# Patient Record
Sex: Male | Born: 1935 | Race: Black or African American | Hispanic: No | Marital: Married | Smoking: Current every day smoker
Health system: Southern US, Community
[De-identification: ages and names within clinical notes are randomized; demographics above are authoritative.]

## PROBLEM LIST (undated history)

## (undated) DIAGNOSIS — M1990 Unspecified osteoarthritis, unspecified site: Secondary | ICD-10-CM

## (undated) DIAGNOSIS — T783XXA Angioneurotic edema, initial encounter: Secondary | ICD-10-CM

## (undated) DIAGNOSIS — M545 Low back pain, unspecified: Secondary | ICD-10-CM

## (undated) DIAGNOSIS — K219 Gastro-esophageal reflux disease without esophagitis: Secondary | ICD-10-CM

## (undated) DIAGNOSIS — E7800 Pure hypercholesterolemia, unspecified: Secondary | ICD-10-CM

## (undated) DIAGNOSIS — R269 Unspecified abnormalities of gait and mobility: Secondary | ICD-10-CM

## (undated) DIAGNOSIS — H409 Unspecified glaucoma: Secondary | ICD-10-CM

## (undated) DIAGNOSIS — E119 Type 2 diabetes mellitus without complications: Secondary | ICD-10-CM

## (undated) DIAGNOSIS — E663 Overweight: Secondary | ICD-10-CM

## (undated) DIAGNOSIS — I10 Essential (primary) hypertension: Secondary | ICD-10-CM

## (undated) DIAGNOSIS — F17200 Nicotine dependence, unspecified, uncomplicated: Secondary | ICD-10-CM

## (undated) DIAGNOSIS — I4510 Unspecified right bundle-branch block: Secondary | ICD-10-CM

## (undated) HISTORY — DX: Low back pain: M54.5

## (undated) HISTORY — DX: Angioneurotic edema, initial encounter: T78.3XXA

## (undated) HISTORY — DX: Overweight: E66.3

## (undated) HISTORY — DX: Gastro-esophageal reflux disease without esophagitis: K21.9

## (undated) HISTORY — PX: OTHER SURGICAL HISTORY: SHX169

## (undated) HISTORY — DX: Type 2 diabetes mellitus without complications: E11.9

## (undated) HISTORY — DX: Unspecified abnormalities of gait and mobility: R26.9

## (undated) HISTORY — DX: Unspecified glaucoma: H40.9

## (undated) HISTORY — DX: Nicotine dependence, unspecified, uncomplicated: F17.200

## (undated) HISTORY — DX: Unspecified right bundle-branch block: I45.10

## (undated) HISTORY — DX: Essential (primary) hypertension: I10

## (undated) HISTORY — DX: Unspecified osteoarthritis, unspecified site: M19.90

## (undated) HISTORY — DX: Low back pain, unspecified: M54.50

## (undated) HISTORY — DX: Pure hypercholesterolemia, unspecified: E78.00

---

## 2002-12-30 ENCOUNTER — Encounter: Payer: Self-pay | Admitting: Ophthalmology

## 2003-01-02 ENCOUNTER — Ambulatory Visit (HOSPITAL_COMMUNITY): Admission: RE | Admit: 2003-01-02 | Discharge: 2003-01-02 | Payer: Self-pay | Admitting: Ophthalmology

## 2003-02-13 ENCOUNTER — Ambulatory Visit (HOSPITAL_COMMUNITY): Admission: RE | Admit: 2003-02-13 | Discharge: 2003-02-13 | Payer: Self-pay | Admitting: Ophthalmology

## 2004-04-12 ENCOUNTER — Ambulatory Visit (HOSPITAL_COMMUNITY): Admission: RE | Admit: 2004-04-12 | Discharge: 2004-04-12 | Payer: Self-pay | Admitting: Ophthalmology

## 2004-05-25 ENCOUNTER — Ambulatory Visit: Payer: Self-pay | Admitting: Pulmonary Disease

## 2004-08-25 ENCOUNTER — Ambulatory Visit: Payer: Self-pay | Admitting: Pulmonary Disease

## 2005-02-21 ENCOUNTER — Ambulatory Visit: Payer: Self-pay | Admitting: Pulmonary Disease

## 2005-05-30 ENCOUNTER — Ambulatory Visit: Payer: Self-pay | Admitting: Pulmonary Disease

## 2005-05-31 ENCOUNTER — Ambulatory Visit: Payer: Self-pay | Admitting: Pulmonary Disease

## 2005-08-22 ENCOUNTER — Ambulatory Visit: Payer: Self-pay | Admitting: Pulmonary Disease

## 2006-02-19 ENCOUNTER — Ambulatory Visit: Payer: Self-pay | Admitting: Pulmonary Disease

## 2006-02-20 ENCOUNTER — Ambulatory Visit: Payer: Self-pay | Admitting: Pulmonary Disease

## 2006-08-22 ENCOUNTER — Ambulatory Visit: Payer: Self-pay | Admitting: Pulmonary Disease

## 2007-02-18 ENCOUNTER — Ambulatory Visit: Payer: Self-pay | Admitting: Pulmonary Disease

## 2007-02-19 ENCOUNTER — Ambulatory Visit: Payer: Self-pay | Admitting: Pulmonary Disease

## 2007-02-19 LAB — CONVERTED CEMR LAB
ALT: 16 units/L (ref 0–53)
AST: 18 units/L (ref 0–37)
Alkaline Phosphatase: 55 units/L (ref 39–117)
BUN: 15 mg/dL (ref 6–23)
Basophils Absolute: 0 10*3/uL (ref 0.0–0.1)
Basophils Relative: 0.1 % (ref 0.0–1.0)
Bilirubin, Direct: 0.1 mg/dL (ref 0.0–0.3)
Calcium: 9 mg/dL (ref 8.4–10.5)
Eosinophils Absolute: 0.2 10*3/uL (ref 0.0–0.6)
GFR calc Af Amer: 93 mL/min
HDL: 41.8 mg/dL (ref >39.0)
LDL Cholesterol: 71 mg/dL (ref 0–99)
Neutrophils Relative %: 34.9 % — ABNORMAL LOW (ref 43.0–77.0)
PSA: 0.38 ng/mL (ref 0.10–4.00)
Platelets: 201 10*3/uL (ref 150–400)
Potassium: 4.4 meq/L (ref 3.5–5.1)
RBC: 3.71 M/uL — ABNORMAL LOW (ref 4.22–5.81)
RDW: 12.6 % (ref 11.5–14.6)
Sodium: 140 meq/L (ref 135–145)
TSH: 2.4 microintl units/mL (ref 0.35–5.50)
Total CHOL/HDL Ratio: 3
VLDL: 11 mg/dL (ref 0–40)

## 2007-08-20 DIAGNOSIS — M1990 Unspecified osteoarthritis, unspecified site: Secondary | ICD-10-CM

## 2007-08-20 DIAGNOSIS — E785 Hyperlipidemia, unspecified: Secondary | ICD-10-CM | POA: Insufficient documentation

## 2007-08-20 DIAGNOSIS — M545 Low back pain, unspecified: Secondary | ICD-10-CM | POA: Insufficient documentation

## 2007-08-20 DIAGNOSIS — I10 Essential (primary) hypertension: Secondary | ICD-10-CM

## 2007-08-20 DIAGNOSIS — I4510 Unspecified right bundle-branch block: Secondary | ICD-10-CM

## 2007-08-21 ENCOUNTER — Ambulatory Visit: Payer: Self-pay | Admitting: Pulmonary Disease

## 2007-09-07 DIAGNOSIS — F17200 Nicotine dependence, unspecified, uncomplicated: Secondary | ICD-10-CM | POA: Insufficient documentation

## 2007-09-07 DIAGNOSIS — H409 Unspecified glaucoma: Secondary | ICD-10-CM | POA: Insufficient documentation

## 2007-09-07 DIAGNOSIS — E663 Overweight: Secondary | ICD-10-CM | POA: Insufficient documentation

## 2008-02-17 ENCOUNTER — Ambulatory Visit: Payer: Self-pay | Admitting: Pulmonary Disease

## 2008-02-18 ENCOUNTER — Ambulatory Visit: Payer: Self-pay | Admitting: Pulmonary Disease

## 2008-02-21 LAB — CONVERTED CEMR LAB
ALT: 13 units/L (ref 0–53)
AST: 16 units/L (ref 0–37)
Alkaline Phosphatase: 59 units/L (ref 39–117)
Basophils Relative: 0.3 % (ref 0.0–3.0)
Bilirubin, Direct: 0.1 mg/dL (ref 0.0–0.3)
CO2: 26 meq/L (ref 19–32)
Calcium: 9.1 mg/dL (ref 8.4–10.5)
Chloride: 108 meq/L (ref 96–112)
Cholesterol: 112 mg/dL (ref 0–200)
Creatinine, Ser: 1.2 mg/dL (ref 0.4–1.5)
Eosinophils Absolute: 0.4 10*3/uL (ref 0.0–0.7)
GFR calc Af Amer: 75 mL/min
Glucose, Bld: 113 mg/dL — ABNORMAL HIGH (ref 70–99)
HCT: 34.5 % — ABNORMAL LOW (ref 39.0–52.0)
HDL: 30.1 mg/dL — ABNORMAL LOW (ref >39.0)
MCV: 99.5 fL (ref 78.0–100.0)
Neutrophils Relative %: 36.3 % — ABNORMAL LOW (ref 43.0–77.0)
Platelets: 200 10*3/uL (ref 150–400)
RDW: 11.8 % (ref 11.5–14.6)
Sodium: 140 meq/L (ref 135–145)
VLDL: 14 mg/dL (ref 0–40)
WBC: 6.1 10*3/uL (ref 4.5–10.5)

## 2008-08-17 ENCOUNTER — Ambulatory Visit: Payer: Self-pay | Admitting: Pulmonary Disease

## 2009-02-15 ENCOUNTER — Ambulatory Visit: Payer: Self-pay | Admitting: Pulmonary Disease

## 2009-02-15 LAB — CONVERTED CEMR LAB
AST: 16 units/L (ref 0–37)
Albumin: 3.9 g/dL (ref 3.5–5.2)
Alkaline Phosphatase: 52 units/L (ref 39–117)
Basophils Relative: 2.7 % (ref 0.0–3.0)
CO2: 30 meq/L (ref 19–32)
Chloride: 107 meq/L (ref 96–112)
Cholesterol: 137 mg/dL (ref 0–200)
Eosinophils Absolute: 0.3 10*3/uL (ref 0.0–0.7)
GFR calc non Af Amer: 82.58 mL/min (ref >60)
Glucose, Bld: 102 mg/dL — ABNORMAL HIGH (ref 70–99)
HCT: 36.2 % — ABNORMAL LOW (ref 39.0–52.0)
HDL: 44.2 mg/dL (ref >39.00)
LDL Cholesterol: 76 mg/dL (ref 0–99)
Lymphocytes Relative: 47.7 % — ABNORMAL HIGH (ref 12.0–46.0)
Neutro Abs: 2.3 10*3/uL (ref 1.4–7.7)
Neutrophils Relative %: 35.9 % — ABNORMAL LOW (ref 43.0–77.0)
PSA: 0.39 ng/mL (ref 0.10–4.00)
Platelets: 181 10*3/uL (ref 150.0–400.0)
Potassium: 4.2 meq/L (ref 3.5–5.1)
RBC: 3.62 M/uL — ABNORMAL LOW (ref 4.22–5.81)
RDW: 12.3 % (ref 11.5–14.6)
Sodium: 141 meq/L (ref 135–145)
TSH: 1.53 microintl units/mL (ref 0.35–5.50)
Total Protein: 7.8 g/dL (ref 6.0–8.3)
WBC: 6.3 10*3/uL (ref 4.5–10.5)

## 2009-08-16 ENCOUNTER — Ambulatory Visit: Payer: Self-pay | Admitting: Pulmonary Disease

## 2010-02-14 ENCOUNTER — Ambulatory Visit: Payer: Self-pay | Admitting: Pulmonary Disease

## 2010-02-15 ENCOUNTER — Ambulatory Visit: Payer: Self-pay | Admitting: Pulmonary Disease

## 2010-02-21 LAB — CONVERTED CEMR LAB
ALT: 12 units/L (ref 0–53)
Alkaline Phosphatase: 54 units/L (ref 39–117)
BUN: 21 mg/dL (ref 6–23)
Bilirubin, Direct: 0.1 mg/dL (ref 0.0–0.3)
Chloride: 104 meq/L (ref 96–112)
GFR calc non Af Amer: 85.05 mL/min (ref >60)
Glucose, Bld: 110 mg/dL — ABNORMAL HIGH (ref 70–99)
HDL: 43.4 mg/dL (ref >39.00)
Hemoglobin: 12.2 g/dL — ABNORMAL LOW (ref 13.0–17.0)
LDL Cholesterol: 77 mg/dL (ref 0–99)
Lymphocytes Relative: 45.4 % (ref 12.0–46.0)
Lymphs Abs: 2.7 10*3/uL (ref 0.7–4.0)
MCV: 100.2 fL — ABNORMAL HIGH (ref 78.0–100.0)
Monocytes Absolute: 0.6 10*3/uL (ref 0.1–1.0)
Monocytes Relative: 10.4 % (ref 3.0–12.0)
Neutro Abs: 2.4 10*3/uL (ref 1.4–7.7)
Neutrophils Relative %: 40 % — ABNORMAL LOW (ref 43.0–77.0)
Platelets: 177 10*3/uL (ref 150.0–400.0)
RDW: 13.5 % (ref 11.5–14.6)
Sodium: 141 meq/L (ref 135–145)
VLDL: 12.2 mg/dL (ref 0.0–40.0)

## 2010-08-09 NOTE — Assessment & Plan Note (Signed)
Summary: 79m follow up/klw   Visit Type:  followup  CC:  6 month ROV & review of mult medical problems....  History of Present Illness: 77 y/o BM here for a follow up visit...  he has multiple medical problems as noted below...     ~  August 17, 2008:  today c/o arthritis pain, esp in his hands... he uses Tramadol Prn w/ good response... no other complaints or concerns... he wants refill of meds for 2010... he is due for Tetanus shot & Pneumovax, he decided against the flu shots in 2009...   ~  February 15, 2009:  good 6 months- still smoking his pipe- 1 pouch qweek "it's the only fun I have"... no new complaints or concerns... due for yearly CXR & fasting blood work...   ~  August 16, 2009:  he continues stable- no new complaints... BP controlled on meds;  denies CP, palpit, SOB, or edema;  still smoking his pipe... he requests refills for 2011.   ~  February 14, 2010:  another stable 47mo- no new complaints or concerns... his wife was Dx w/ colon cancer- s/p surg & apparently one pos node> incr stress for the family...  BP remains controlled;  Chol has been good on the Simva40;  weight stable;  atthritis reasonable w/ Tramadol Prn;  interestingly> he still refuses colonoscopy despite wife's dx w/ colon ca... he will ret for FASTING labs.    Current Problem List:  GLAUCOMA (ICD-365.9) - on gtts per [facility]...  PIPE SMOKER (ICD-305.1) - still not interested in smoking cessation counselling...  HYPERTENSION (ICD-401.9) - controlled on TOPROL XL 50mg /d and HYZAAR 50/d... BP=130/68 doing well- denies HA, fatigue, visual changes, CP, palipit, dizziness, syncope, dyspnea, edema, etc...  ~  CXR 8/10 showed norm heart size, sl incr markings, NAD...  Labs WNL...  RIGHT BUNDLE BRANCH BLOCK (ICD-426.4) - on ASA 81mg /d... old EKG's w/ RBBB, otherw normal... as noted- no CP, palpit, SOB, etc...  HYPERCHOLESTEROLEMIA (ICD-272.0) - on SIMVASTATIN 40mg /d + diet...   ~  FLP 8/08 showed TChol  124, TG 56, HDL 42, LDL 71... continue Rx.  ~  FLP 8/09 showed TChol 112, TG 72, HDL 30, LDL 68... rec- same med, incr exercise.  ~  FLP 8/10 showed TChol 137, TG 85, HDL 44, LDL 76  ~  FLP 8/11 showed TChol 133, TG 61, HDL 43, LDL 77  OVERWEIGHT (ICD-278.02) - discussed diet + exercise program...  ~  weight 8/09 = 202#  ~  weight 2/10 = 211#... he knows that he needs to do better.  ~  weight 8/10 = 210#  ~  weight 2/11 = 209#  ~  weight 8/11 = 211#  GERD (ICD-530.81) - no recent symptoms... doing satis overall... we discussed Prilosec vs Zantac OTC Prn.  DEGENERATIVE JOINT DISEASE (ICD-715.90) - using Tramadol Prn... he notes some wrist pain- ACE wrap + Tramadol helps.  BACK PAIN, LUMBAR (ICD-724.2)  ABNORMALITY OF GAIT (ICD-781.2)  Hx of ANGIOEDEMA (ICD-995.1) - ? etiology... no recurrence.  SURG/OTH PROC NOT CARRIED OUT BECAUSE PTS DECN (ICD-V64.2) - discussed again... he continues to refuse routine colonoscopy despite his wife's Dx w/ colon cancer 5/11...  Health Maintenance - he decided against the flu shots in 2009 & 2010... had TETANUS shot & PNEUMOVAX Feb2010...   Preventive Screening-Counseling & Management  Alcohol-Tobacco     Smoking Status: current pipe smoker     Pipe use/week: pipe 3 x a week  Current Medications (verified): 1)  Cosopt 2-0.5 %  Soln (Dorzolamide-Timolol) .... As Needed 2)  Travatan 0.004 %  Soln (Travoprost) .... As Needed 3)  Adult Aspirin Low Strength 81 Mg  Tbdp (Aspirin) .Marland Kitchen.. 1 Tab Daily.Marland KitchenMarland Kitchen 4)  Toprol Xl 50 Mg  Tb24 (Metoprolol Succinate) .... Take 1 Tablet By Mouth Once A Day 5)  Hyzaar 50-12.5 Mg  Tabs (Losartan Potassium-Hctz) .... Take 1 Tablet By Mouth Once A Day 6)  Simvastatin 40 Mg  Tabs (Simvastatin) .... Take One Tablet By Mouth At Bedtime 7)  Tramadol Hcl 50 Mg  Tabs (Tramadol Hcl) .... Take One Tablet By Mouth Every 4-6 Hours As Needed For Arthritis Pain...  Allergies: 1)  ! Iodine  Past History:  Past Medical  History: GLAUCOMA (ICD-365.9) PIPE SMOKER (ICD-305.1) HYPERTENSION (ICD-401.9) RIGHT BUNDLE BRANCH BLOCK (ICD-426.4) HYPERCHOLESTEROLEMIA (ICD-272.0) OVERWEIGHT (ICD-278.02) GERD (ICD-530.81) DEGENERATIVE JOINT DISEASE (ICD-715.90) BACK PAIN, LUMBAR (ICD-724.2) ABNORMALITY OF GAIT (ICD-781.2) Hx of ANGIOEDEMA (ICD-995.1) SURG/OTH PROC NOT CARRIED OUT BECAUSE PTS DECN (ICD-V64.2) - he has repeatedly refused colonoscopy... also refuses FLU shots...  Past Surgical History: S/P cataract surgery in 2004 & 2005 by DrJacklin  Family History: Reviewed history from 02/15/2009 and no changes required. Father died in his 77's w/ GSW, hx of heart disease Mother died in her 9's ?cause 5 Siblings: 2 Brothers died- trauma, alcoholism 3 Sisters- one died w/ internal bleeding  Social History: Reviewed history from 02/15/2009 and no changes required. married, wife= Ora, 51 yrs. 2 children etoh occasionally no caffeine current smoker, pipe daily... retired [employer] Smoking Status:  current pipe smoker  Review of Systems      See HPI       The patient complains of dyspnea on exertion and difficulty walking.  The patient denies anorexia, fever, weight loss, weight gain, vision loss, decreased hearing, hoarseness, chest pain, syncope, peripheral edema, prolonged cough, headaches, hemoptysis, abdominal pain, melena, hematochezia, severe indigestion/heartburn, hematuria, incontinence, muscle weakness, suspicious skin lesions, transient blindness, depression, unusual weight change, abnormal bleeding, enlarged lymph nodes, and angioedema.    Vital Signs:  Patient profile:   77 year old male Height:      66 inches Weight:      210.50 pounds BMI:     34.10 O2 Sat:      99 % on Room air Temp:     98.1 degrees F oral Pulse rate:   59 / minute BP sitting:   130 / 68  (right arm) Cuff size:   regular  Vitals Entered By: Kandice Hams CMA (February 14, 2010 11:35 AM)  O2 Flow:  Room air CC: 6  month ROV & review of mult medical problems...   Physical Exam  Additional Exam:  WD, WN, 77 y/o BM in NAD... GENERAL:  Alert & oriented; pleasant & cooperative... HEENT:  /AT, EOM-wnl, PERRLA, EACs-clear, TMs-wnl, NOSE-clear, THROAT-clear & wnl. NECK:  Supple w/ fairROM; no JVD; normal carotid impulses w/o bruits; no thyromegaly or nodules palpated; no lymphadenopathy. CHEST:  Clear to P & A; without wheezes/ rales/ or rhonchi heard...  HEART:  Regular Rhythm; without murmurs/ rubs/ or gallops detected... ABDOMEN:  Obese, soft & nontender; normal bowel sounds; no organomegaly or masses palpated... EXT: without deformities, mild arthritic changes; no varicose veins/ venous insuffic/ or edema. NEURO:  CN's intact; +gait abn;  no focal defects... DERM:  No lesions noted; no rash etc...    MISC. Report  Procedure date:  02/15/2010  Findings:      BMP (  METABOL)   Sodium                    141 mEq/L                   135-145   Potassium                 4.3 mEq/L                   3.5-5.1   Chloride                  104 mEq/L                   96-112   Carbon Dioxide            27 mEq/L                    19-32   Glucose              [H]  110 mg/dL                   61-60   BUN                       21 mg/dL                    7-37   Creatinine                1.1 mg/dL                   1.0-6.2   Calcium                   9.1 mg/dL                   6.9-48.5   GFR                       85.05 mL/min                >60  Lipid Panel (LIPID)   Cholesterol               133 mg/dL                   4-627   Triglycerides             61.0 mg/dL                  0.3-500.9   HDL                       38.18 mg/dL                 >29.93   LDL Cholesterol           77 mg/dL                    7-16  Hepatic/Liver Function Panel (HEPATIC)   Total Bilirubin           0.6 mg/dL                   9.6-7.8   Direct Bilirubin          0.1 mg/dL  0.0-0.3   Alkaline Phosphatase       54 U/L                      39-117   AST                       15 U/L                      0-37   ALT                       12 U/L                      0-53   Total Protein             7.1 g/dL                    2.9-5.2   Albumin                   3.9 g/dL                    8.4-1.3  Comments:      CBC Platelet w/Diff (CBCD)   White Cell Count          6.0 K/uL                    4.5-10.5   Red Cell Count       [L]  3.52 Mil/uL                 4.22-5.81   Hemoglobin           [L]  12.2 g/dL                   24.4-01.0   Hematocrit           [L]  35.3 %                      39.0-52.0   MCV                  [H]  100.2 fl                    78.0-100.0   Platelet Count            177.0 K/uL                  150.0-400.0   Neutrophil %         [L]  40.0 %                      43.0-77.0   Lymphocyte %              45.4 %                      12.0-46.0   Monocyte %                10.4 %                      3.0-12.0   Eosinophils%              3.9 %  0.0-5.0   Basophils %               0.3 %                       0.0-3.0   TSH (TSH)   FastTSH                   2.17 uIU/mL                 0.35-5.50  Prostate Specific Antigen (PSA)   PSA-Hyb                   0.44 ng/mL                  0.10-4.00   Impression & Recommendations:  Problem # 1:  PIPE SMOKER (ICD-305.1) we discussed the need to quit all smoking again today...  Problem # 2:  HYPERTENSION (ICD-401.9) Controlled>  same meds. His updated medication list for this problem includes:    Toprol Xl 50 Mg Tb24 (Metoprolol succinate) .Marland Kitchen... Take 1 tablet by mouth once a day    Hyzaar 50-12.5 Mg Tabs (Losartan potassium-hctz) .Marland Kitchen... Take 1 tablet by mouth once a day  Problem # 3:  RIGHT BUNDLE BRANCH BLOCK (ICD-426.4) He is asymptomatic>  no CP, palpit, dizzy, light headed, syncope, etc...  Problem # 4:  HYPERCHOLESTEROLEMIA (ICD-272.0) FLP has been stable on the Simva40 + his diet... His updated medication list  for this problem includes:    Simvastatin 40 Mg Tabs (Simvastatin) .Marland Kitchen... Take one tablet by mouth at bedtime  Problem # 5:  OVERWEIGHT (ICD-278.02) We discussed weight reduction strategies today...  Problem # 6:  DEGENERATIVE JOINT DISEASE (ICD-715.90) He is reasonably well controlled w/ Prn Tramadol Rx... His updated medication list for this problem includes:    Adult Aspirin Low Strength 81 Mg Tbdp (Aspirin) .Marland Kitchen... 1 tab daily...    Tramadol Hcl 50 Mg Tabs (Tramadol hcl) .Marland Kitchen... Take one tablet by mouth every 4-6 hours as needed for arthritis pain...  Problem # 7:  SURG/OTH PROC NOT CARRIED OUT BECAUSE PTS DECN (ICD-V64.2) He continues to decline routine screening colonoscopy> despite his wife's recent colon cancer surg...  Problem # 8:  OTHER MEDICAL PROBLEMS AS NOTED>>>  Complete Medication List: 1)  Cosopt 2-0.5 % Soln (Dorzolamide-timolol) .... As needed 2)  Travatan 0.004 % Soln (Travoprost) .... As needed 3)  Adult Aspirin Low Strength 81 Mg Tbdp (Aspirin) .Marland Kitchen.. 1 tab daily.Marland KitchenMarland Kitchen 4)  Toprol Xl 50 Mg Tb24 (Metoprolol succinate) .... Take 1 tablet by mouth once a day 5)  Hyzaar 50-12.5 Mg Tabs (Losartan potassium-hctz) .... Take 1 tablet by mouth once a day 6)  Simvastatin 40 Mg Tabs (Simvastatin) .... Take one tablet by mouth at bedtime 7)  Tramadol Hcl 50 Mg Tabs (Tramadol hcl) .... Take one tablet by mouth every 4-6 hours as needed for arthritis pain...  Patient Instructions: 1)  Today we updated your med list- see below.... 2)  Continue your current medications the same... 3)  Please return to our lab one morning this week for your FASTING blood work... then please call the "phone tree" in a few days for your lab results.Marland KitchenMarland Kitchen 4)  Stay as active as possible... 5)  Call for any problems.Marland KitchenMarland Kitchen 6)  Please schedule a follow-up appointment in 6 months.

## 2010-08-09 NOTE — Assessment & Plan Note (Signed)
Summary: 6 months/apc   CC:  6 month ROV & review of mult medical problems....  History of Present Illness: 77 y/o BM here for a follow up visit...  he has multiple medical problems as noted below...     ~  August 17, 2008:  today c/o arthritis pain, esp in his hands... he uses Tramadol Prn w/ good response... no other complaints or concerns... he wants refill of meds for 2010... he is due for Tetanus shot & Pneumovax, he decided against the flu shots in 2009...   ~  February 15, 2009:  good 6 months- still smoking his pipe- 1 pouch qweek "it's the only fun I have"... no new complaints or concerns... due for yearly CXR & fasting blood work...   ~  August 16, 2009:  he continues stable- no new complaints... BP controlled on meds;  denies CP, palpit, SOB, or edema;  still smoking his pipe... he requests refills for 2011.    Current Problem List:  GLAUCOMA (ICD-365.9) - on gtts per [facility]...  PIPE SMOKER (ICD-305.1) - still not interested in smoking cessation counselling...  HYPERTENSION (ICD-401.9) - controlled on TOPROL XL 50mg /d and HYZAAR 50/d... BP=114/66 doing well- denies HA, fatigue, visual changes, CP, palipit, dizziness, syncope, dyspnea, edema, etc...  ~  CXR 8/10 showed norm heart size, sl incr markings, NAD...  Labs WNL...  RIGHT BUNDLE BRANCH BLOCK (ICD-426.4) - on ASA 81mg /d... old EKG's w/ RBBB, otherw normal... as noted- no CP, palpit, SOB, etc...  HYPERCHOLESTEROLEMIA (ICD-272.0) - on SIMVASTATIN 40mg /d + diet...   ~  FLP 8/08 showed TChol 124, TG 56, HDL 42, LDL 71... continue Rx.  ~  FLP 8/09 showed TChol 112, TG 72, HDL 30, LDL 68... rec- same med, incr exercise.  ~  FLP 8/10 showed TChol 137, TG 85, HDL 44, LDL 76  ~  FLP 2/11 showed TChol   OVERWEIGHT (ICD-278.02) - discussed diet + exercise program...  ~  weight 8/09 = 202#  ~  weight 2/10 = 211#... he knows that he needs to do better.  ~  weight 8/10 = 210#  ~  weight 2/11 = 209#  GERD  (ICD-530.81) - no recent symptoms... doing satis overall... we discussed Prilosec vs Zantac OTC Prn.  DEGENERATIVE JOINT DISEASE (ICD-715.90) - using Tramadol Prn... he notes some wrist pain- ACE + Tramadol helps.  BACK PAIN, LUMBAR (ICD-724.2)  ABNORMALITY OF GAIT (ICD-781.2)  Hx of ANGIOEDEMA (ICD-995.1) - ? etiology... no recurrence.  SURG/OTH PROC NOT CARRIED OUT BECAUSE PTS DECN (ICD-V64.2) - discussed again... he continues to refuse routine colonoscopy...  Health Maintenance - he decided against the flu shots in 2009 & 2010... had TETANUS shot & PNEUMOVAX Feb2010...    Allergies: 1)  ! Iodine  Comments:  Nurse/Medical Assistant: The patient's medications and allergies were reviewed with the patient and were updated in the Medication and Allergy Lists.  Past History:  Past Medical History:  GLAUCOMA (ICD-365.9) PIPE SMOKER (ICD-305.1) HYPERTENSION (ICD-401.9) RIGHT BUNDLE BRANCH BLOCK (ICD-426.4) HYPERCHOLESTEROLEMIA (ICD-272.0) OVERWEIGHT (ICD-278.02) GERD (ICD-530.81) DEGENERATIVE JOINT DISEASE (ICD-715.90) BACK PAIN, LUMBAR (ICD-724.2) ABNORMALITY OF GAIT (ICD-781.2) Hx of ANGIOEDEMA (ICD-995.1) SURG/OTH PROC NOT CARRIED OUT BECAUSE PTS DECN (ICD-V64.2) - he has repeatedly refused colonoscopy... also refuses FLU shots...  Past Surgical History: S/P cataract surgery in 2004 & 2005 by DrJacklin  Family History: Reviewed history from 02/15/2009 and no changes required. Father died in his 48's w/ GSW, hx of heart disease Mother died in her  20's ?cause 5 Siblings: 2 Brothers died- trauma, alcoholism 3 Sisters- one died w/ internal bleeding  Social History: Reviewed history from 02/15/2009 and no changes required. married, wife= Ora, 51 yrs. 2 children etoh occasionally no caffeine current smoker, pipe daily... retired [employer]  Review of Systems      See HPI       The patient complains of dyspnea on exertion.  The patient denies anorexia, fever,  weight loss, weight gain, vision loss, decreased hearing, hoarseness, chest pain, syncope, peripheral edema, prolonged cough, headaches, hemoptysis, abdominal pain, melena, hematochezia, severe indigestion/heartburn, hematuria, incontinence, muscle weakness, suspicious skin lesions, transient blindness, difficulty walking, depression, unusual weight change, abnormal bleeding, enlarged lymph nodes, and angioedema.    Vital Signs:  Patient profile:   77 year old male Height:      66 inches Weight:      209.13 pounds O2 Sat:      98 % on Room air Temp:     98.0 degrees F oral Pulse rate:   60 / minute BP sitting:   114 / 66  (right arm) Cuff size:   regular  Vitals Entered By: Randell Loop CMA (August 16, 2009 11:25 AM)  O2 Sat at Rest %:  98 O2 Flow:  Room air CC: 6 month ROV & review of mult medical problems... Is Patient Diabetic? No Pain Assessment Patient in pain? no      Comments pt brought all meds today---no changes in meds   Physical Exam  Additional Exam:  WD, WN, 77 y/o BM in NAD... GENERAL:  Alert & oriented; pleasant & cooperative... HEENT:  [place]/AT, EOM-wnl, PERRLA, EACs-clear, TMs-wnl, NOSE-clear, THROAT-clear & wnl. NECK:  Supple w/ fairROM; no JVD; normal carotid impulses w/o bruits; no thyromegaly or nodules palpated; no lymphadenopathy. CHEST:  Clear to P & A; without wheezes/ rales/ or rhonchi heard...  HEART:  Regular Rhythm; without murmurs/ rubs/ or gallops detected... ABDOMEN:  Obese, soft & nontender; normal bowel sounds; no organomegaly or masses palpated... EXT: without deformities, mild arthritic changes; no varicose veins/ venous insuffic/ or edema. NEURO:  CN's intact; +gait abn;  no focal defects... DERM:  No lesions noted; no rash etc...     Impression & Recommendations:  Problem # 1:  PIPE SMOKER (ICD-305.1) We discussed smoking cessation but he refuses...  Problem # 2:  HYPERTENSION (ICD-401.9) Controlled-  continue same meds. His  updated medication list for this problem includes:    Toprol Xl 50 Mg Tb24 (Metoprolol succinate) .Marland Kitchen... Take 1 tablet by mouth once a day    Hyzaar 50-12.5 Mg Tabs (Losartan potassium-hctz) .Marland Kitchen... Take 1 tablet by mouth once a day  Problem # 3:  RIGHT BUNDLE BRANCH BLOCK (ICD-426.4) Aware-  no CP, plalpit, dizzy, etc...  Problem # 4:  HYPERCHOLESTEROLEMIA (ICD-272.0) Stable on the simva40... contiue same. His updated medication list for this problem includes:    Simvastatin 40 Mg Tabs (Simvastatin) .Marland Kitchen... Take one tablet by mouth at bedtime  Problem # 5:  OVERWEIGHT (ICD-278.02) Diet + exercise are the keys... get weight down.  Problem # 6:  DEGENERATIVE JOINT DISEASE (ICD-715.90) He ha mod DJD, LBP, gait abn... uses Tramadol Prn... His updated medication list for this problem includes:    Adult Aspirin Low Strength 81 Mg Tbdp (Aspirin) .Marland Kitchen... 1 tab daily...    Tramadol Hcl 50 Mg Tabs (Tramadol hcl) .Marland Kitchen... Take one tablet by mouth every 4-6 hours as needed for arthritis pain...  Problem # 7:  SURG/OTH PROC  NOT CARRIED OUT BECAUSE PTS DECN (ICD-V64.2) He continues to refuse colonoscopy despite the risk of colon cancer...  Problem # 8:  OTHER MEDICAL PROBLEMS AS NOTED>>>  Complete Medication List: 1)  Cosopt 2-0.5 % Soln (Dorzolamide-timolol) .... As needed 2)  Travatan 0.004 % Soln (Travoprost) .... As needed 3)  Adult Aspirin Low Strength 81 Mg Tbdp (Aspirin) .Marland Kitchen.. 1 tab daily.Marland KitchenMarland Kitchen 4)  Toprol Xl 50 Mg Tb24 (Metoprolol succinate) .... Take 1 tablet by mouth once a day 5)  Hyzaar 50-12.5 Mg Tabs (Losartan potassium-hctz) .... Take 1 tablet by mouth once a day 6)  Simvastatin 40 Mg Tabs (Simvastatin) .... Take one tablet by mouth at bedtime 7)  Tramadol Hcl 50 Mg Tabs (Tramadol hcl) .... Take one tablet by mouth every 4-6 hours as needed for arthritis pain...  Other Orders: Prescription Created Electronically [id number])  Patient Instructions: 1)  Today we updated your med list- see  below.... 2)  We decided to continue your current meds the same... 3)  Let's get on track w/ our diet + exercise program>> the goal is to lose some weight!! 4)  Call for any problem... 5)  Please schedule a follow-up appointment in 6 months. Prescriptions: TRAMADOL HCL 50 MG  TABS (TRAMADOL HCL) take one tablet by mouth every 4-6 hours as needed for arthritis pain...  #100 x prn   Entered and Authorized by:   Michele Mcalpine MD   Signed by:   Michele Mcalpine MD on 08/16/2009   Method used:   Print then Give to Patient   RxID:   [id number] SIMVASTATIN 40 MG  TABS (SIMVASTATIN) take one tablet by mouth at bedtime  #30 x prn   Entered and Authorized by:   Michele Mcalpine MD   Signed by:   Michele Mcalpine MD on 08/16/2009   Method used:   Print then Give to Patient   RxID:   [id number] HYZAAR 50-12.5 MG  TABS (LOSARTAN POTASSIUM-HCTZ) Take 1 tablet by mouth once a day  #30 x prn   Entered and Authorized by:   Michele Mcalpine MD   Signed by:   Michele Mcalpine MD on 08/16/2009   Method used:   Print then Give to Patient   RxID:   [id number] TOPROL XL 50 MG  TB24 (METOPROLOL SUCCINATE) Take 1 tablet by mouth once a day  #30 x prn   Entered and Authorized by:   Michele Mcalpine MD   Signed by:   Michele Mcalpine MD on 08/16/2009   Method used:   Print then Give to Patient   RxID:   [id number]

## 2010-08-15 ENCOUNTER — Ambulatory Visit (INDEPENDENT_AMBULATORY_CARE_PROVIDER_SITE_OTHER): Payer: Medicare Other | Admitting: Pulmonary Disease

## 2010-08-15 ENCOUNTER — Encounter: Payer: Self-pay | Admitting: Pulmonary Disease

## 2010-08-15 ENCOUNTER — Ambulatory Visit: Payer: Self-pay | Admitting: Pulmonary Disease

## 2010-08-15 DIAGNOSIS — K219 Gastro-esophageal reflux disease without esophagitis: Secondary | ICD-10-CM

## 2010-08-15 DIAGNOSIS — M545 Low back pain, unspecified: Secondary | ICD-10-CM

## 2010-08-15 DIAGNOSIS — M1990 Unspecified osteoarthritis, unspecified site: Secondary | ICD-10-CM

## 2010-08-15 DIAGNOSIS — I10 Essential (primary) hypertension: Secondary | ICD-10-CM

## 2010-08-15 DIAGNOSIS — F17200 Nicotine dependence, unspecified, uncomplicated: Secondary | ICD-10-CM

## 2010-08-15 DIAGNOSIS — E663 Overweight: Secondary | ICD-10-CM

## 2010-08-15 DIAGNOSIS — I4510 Unspecified right bundle-branch block: Secondary | ICD-10-CM

## 2010-08-15 DIAGNOSIS — E7800 Pure hypercholesterolemia, unspecified: Secondary | ICD-10-CM

## 2010-08-25 NOTE — Assessment & Plan Note (Signed)
Summary: 6 MONTH FOLLOW-UP/LED   CC:  6 month ROV & review of mult medical problems....  History of Present Illness: 77 y/o Evans here for a follow up visit...  he has multiple medical problems as noted below...     ~  February 14, 2010:  another stable 80mo- no new complaints or concerns... his wife was Dx w/ colon cancer- s/p surg & apparently one pos node> incr stress for the family...  BP remains controlled;  Chol has been good on the Simva40;  weight stable;  atthritis reasonable w/ Tramadol Prn;  interestingly> he still refuses colonoscopy despite wife's dx w/ colon ca... he will ret for FASTING labs.   ~  August 15, 2010:  he continues to do well- no new complaints or concerns... still smoking pipe regularly & not interested in smoking cessation help;  BP controlled on meds;  Chol has been good on Simva40;  his CC remains arthritis pains & Tramadol helps;  he continues to refuse colonoscopy & states he's doing well w/o pain, constip, blood, etc... OK refills for 2012.    Current Problem List:  GLAUCOMA (ICD-365.9) - on gtts per [facility]...  PIPE SMOKER (ICD-305.1) - he continues to consume 1 pouch of cherry blend pipe tobacco weekly & still not interested in smoking cessation help...  HYPERTENSION (ICD-401.9) - controlled on TOPROL XL 50mg /d and HYZAAR 50/d... BP=130/70 doing well- denies HA, fatigue, visual changes, CP, palipit, dizziness, syncope, dyspnea, edema, etc...  ~  CXR 8/10 showed norm heart size, sl incr markings, NAD...  Labs WNL...  RIGHT BUNDLE BRANCH BLOCK (ICD-426.4) - on ASA 81mg /d... old EKG's w/ RBBB, otherw normal... as noted- no CP, palpit, SOB, etc...  HYPERCHOLESTEROLEMIA (ICD-272.0) - on SIMVASTATIN 40mg /d + diet...   ~  FLP 8/08 showed TChol 124, TG 56, HDL 42, LDL 71... continue Rx.  ~  FLP 8/09 showed TChol 112, TG 72, HDL 30, LDL 68... rec- same med, incr exercise.  ~  FLP 8/10 showed TChol 137, TG 85, HDL 44, LDL 76  ~  FLP 8/11 showed TChol 133,  TG 61, HDL 43, LDL 77  OVERWEIGHT (ICD-278.02) - discussed diet + exercise program...  ~  weight 8/09 = 202#  ~  weight 2/10 = 211#... he knows that he needs to do better.  ~  weight 8/10 = 210#  ~  weight 2/11 = 209#  ~  weight 8/11 = 211#... we reviewed diet & exercise.  ~  weight 2/12 = 215#  GERD (ICD-530.81) - no recent symptoms... doing satis overall... we discussed Prilosec vs Zantac OTC Prn.  DEGENERATIVE JOINT DISEASE (ICD-715.90) - using Tramadol Prn... he notes some wrist pain- ACE wrap + Tramadol helps.  BACK PAIN, LUMBAR (ICD-724.2)  ABNORMALITY OF GAIT (ICD-781.2)  Hx of ANGIOEDEMA (ICD-995.1) - ? etiology... no recurrence.  SURG/OTH PROC NOT CARRIED OUT BECAUSE PTS DECN (ICD-V64.2) - discussed again... he continues to refuse routine colonoscopy despite his wife's Dx w/ colon cancer 5/11...  Health Maintenance - he refuses the Flu vaccines but had TETANUS shot & PNEUMOVAX Feb2010...   Allergies: 1)  ! Iodine  Past History:  Past Medical History: GLAUCOMA (ICD-365.9) PIPE SMOKER (ICD-305.1) HYPERTENSION (ICD-401.9) RIGHT BUNDLE BRANCH BLOCK (ICD-426.4) HYPERCHOLESTEROLEMIA (ICD-272.0) OVERWEIGHT (ICD-278.02) GERD (ICD-530.81) DEGENERATIVE JOINT DISEASE (ICD-715.90) BACK PAIN, LUMBAR (ICD-724.2) ABNORMALITY OF GAIT (ICD-781.2) Hx of ANGIOEDEMA (ICD-995.1) SURG/OTH PROC NOT CARRIED OUT BECAUSE PTS DECN (ICD-V64.2) - he has repeatedly refused colonoscopy... also refuses FLU shots.  Past Surgical History: S/P cataract surgery in 2004 & 2005 by DrJacklin  Family History: Reviewed history from 02/14/2010 and no changes required. Father died in his 24's w/ GSW, hx of heart disease Mother died in her 97's ?cause 5 Siblings: 2 Brothers died- trauma, alcoholism 3 Sisters- one died w/ internal bleeding  Social History: Reviewed history from 02/14/2010 and no changes required. married, wife= Ora, 51 yrs. 2 children etoh occasionally no caffeine current  smoker, pipe daily... retired [employer]  Review of Systems      See HPI       The patient complains of dyspnea on exertion and difficulty walking.  The patient denies anorexia, fever, weight loss, weight gain, vision loss, decreased hearing, hoarseness, chest pain, syncope, peripheral edema, prolonged cough, headaches, hemoptysis, abdominal pain, melena, hematochezia, severe indigestion/heartburn, hematuria, incontinence, muscle weakness, suspicious skin lesions, transient blindness, depression, unusual weight change, abnormal bleeding, enlarged lymph nodes, and angioedema.    Vital Signs:  Patient profile:   77 year old male Height:      66 inches Weight:      214.50 pounds BMI:     34.75 O2 Sat:      97 % on Room air Temp:     98.2 degrees F oral Pulse rate:   55 / minute BP sitting:   130 / 70  (left arm) Cuff size:   regular  Vitals Entered By: Carver Fila (August 15, 2010 2:52 PM)  O2 Flow:  Room air CC: 6 month ROV & review of mult medical problems... Comments meds and allergies updated Phone number updated Carver Fila  August 15, 2010 2:52 PM      Physical Exam  Additional Exam:  WD, WN, 77 y/o Evans in NAD... GENERAL:  Alert & oriented; pleasant & cooperative... HEENT:  [place]/AT, EOM-wnl, PERRLA, EACs-clear, TMs-wnl, NOSE-clear, THROAT-clear & wnl. NECK:  Supple w/ fairROM; no JVD; normal carotid impulses w/o bruits; no thyromegaly or nodules palpated; no lymphadenopathy. CHEST:  Clear to P & A; without wheezes/ rales/ or rhonchi heard...  HEART:  Regular Rhythm; without murmurs/ rubs/ or gallops detected... ABDOMEN:  Obese, soft & nontender; normal bowel sounds; no organomegaly or masses palpated... EXT: without deformities, mod arthritic changes; no varicose veins/ venous insuffic/ or edema. NEURO:  CN's intact; +gait abn;  no focal defects... DERM:  No lesions noted; no rash etc...    Impression & Recommendations:  Problem # 1:  PIPE SMOKER (ICD-305.1) He  is totally NOT motivated to cut back or quit & wants [place] to leave him alone about this!!!  Problem # 2:  HYPERTENSION (ICD-401.9) Controlled on meds>  continue same. His updated medication list for this problem includes:    Toprol Xl 50 Mg Tb24 (Metoprolol succinate) .Marland Kitchen... Take 1 tablet by mouth once a day    Hyzaar 50-12.5 Mg Tabs (Losartan potassium-hctz) .Marland Kitchen... Take 1 tablet by mouth once a day  Problem # 3:  HYPERCHOLESTEROLEMIA (ICD-272.0) Stable on Simva40 & he is content to continue as is... His updated medication list for this problem includes:    Simvastatin 40 Mg Tabs (Simvastatin) .Marland Kitchen... Take one tablet by mouth at bedtime  Problem # 4:  OVERWEIGHT (ICD-278.02) Weight continues to climb slowly & we reviewed the need for diet, exercise, etc...  Problem # 5:  GERD (ICD-530.81) Stable on Prn OTC PPI vs H2 blocker Rx...  Problem # 6:  SURG/OTH PROC NOT CARRIED OUT BECAUSE PTS DECN (ICD-V64.2) He contrinues to refuse colonoscopy,  but similarly denies abd pain, constip, blood, etc...  Problem # 7:  DEGENERATIVE JOINT DISEASE (ICD-715.90) He has mod DJD but states that Tramadol works satis & not limited in activities... His updated medication list for this problem includes:    Adult Aspirin Low Strength 81 Mg Tbdp (Aspirin) .Marland Kitchen... 1 tab daily...    Tramadol Hcl 50 Mg Tabs (Tramadol hcl) .Marland Kitchen... Take one tablet by mouth every 4-6 hours as needed for arthritis pain...  Complete Medication List: 1)  Cosopt 2-0.5 % Soln (Dorzolamide-timolol) .... As needed 2)  Travatan 0.004 % Soln (Travoprost) .... As needed 3)  Adult Aspirin Low Strength 81 Mg Tbdp (Aspirin) .Marland Kitchen.. 1 tab daily.Marland KitchenMarland Kitchen 4)  Toprol Xl 50 Mg Tb24 (Metoprolol succinate) .... Take 1 tablet by mouth once a day 5)  Hyzaar 50-12.5 Mg Tabs (Losartan potassium-hctz) .... Take 1 tablet by mouth once a day 6)  Simvastatin 40 Mg Tabs (Simvastatin) .... Take one tablet by mouth at bedtime 7)  Tramadol Hcl 50 Mg Tabs (Tramadol hcl) .... Take  one tablet by mouth every 4-6 hours as needed for arthritis pain...  Patient Instructions: 1)  Today we updated your med list- see below.... 2)  Continue your current meds the same... 3)  We refilled your meds for 2012... 4)  Stay as active as possible... 5)  Call for any problems.Marland KitchenMarland Kitchen 6)  Please schedule a follow-up appointment in 6 months, with FASTING blood work at that time... Prescriptions: TRAMADOL HCL 50 MG  TABS (TRAMADOL HCL) take one tablet by mouth every 4-6 hours as needed for arthritis pain...  #100 x 12   Entered and Authorized by:   Michele Mcalpine MD   Signed by:   Michele Mcalpine MD on 08/15/2010   Method used:   Print then Give to Patient   RxID:   [id number] SIMVASTATIN 40 MG  TABS (SIMVASTATIN) take one tablet by mouth at bedtime  #30 x 12   Entered and Authorized by:   Michele Mcalpine MD   Signed by:   Michele Mcalpine MD on 08/15/2010   Method used:   Print then Give to Patient   RxID:   [id number] HYZAAR 50-12.5 MG  TABS (LOSARTAN POTASSIUM-HCTZ) Take 1 tablet by mouth once a day  #30 x 12   Entered and Authorized by:   Michele Mcalpine MD   Signed by:   Michele Mcalpine MD on 08/15/2010   Method used:   Print then Give to Patient   RxID:   [id number] TOPROL XL 50 MG  TB24 (METOPROLOL SUCCINATE) Take 1 tablet by mouth once a day  #30 x 12   Entered and Authorized by:   Michele Mcalpine MD   Signed by:   Michele Mcalpine MD on 08/15/2010   Method used:   Print then Give to Patient   RxID:   [id number]

## 2010-11-25 NOTE — Op Note (Signed)
[name], [name]               ACCOUNT NO.:  [id number]   MEDICAL RECORD NO.:  [id number]          PATIENT TYPE:  OIB   LOCATION:  2874                         FACILITY:  [facility]   PHYSICIAN:  Guadelupe Sabin, M.D.DATE OF BIRTH:  09/09/1925   DATE OF PROCEDURE:  04/12/2004  DATE OF DISCHARGE:  04/12/2004                                 OPERATIVE REPORT   PREOPERATIVE DIAGNOSES:  1.  Senile nuclear cataract, left eye.  2.  Chronic open angle glaucoma, left eye.   POSTOPERATIVE DIAGNOSES:  1.  Senile nuclear cataract, left eye.  2.  Chronic open angle glaucoma, left eye.   OPERATION:  Planned extracapsular cataract extraction, phacoemulsification,  primary insertion of posterior chamber intraocular lens implant.   SURGEON:  Guadelupe Sabin, M.D.   ASSISTANT:  Nurse.   ANESTHESIA:  Local 4% Xylocaine, 0.75% Marcaine retrobulbar block, topical  Tetracaine, intraocular Xylocaine.  Anesthesia standby required in this  elderly patient.   OPERATIVE PROCEDURE:  After the patient was prepped and draped, a lid  speculum was inserted in the left eye.  Schiotz tonometry was recorded at  seven scale units with a 5.5 g weight.  A peritomy was performed adjacent to  the limbus from the 11 to 1 o'clock position.  The corneoscleral junction  was cleaned and a corneoscleral groove made with a 45 degree Superblade.  The anterior chamber was then entered with a 2.5 mm diamond keratome at the  12 o'clock position and the 15 degree blade at the 2:30 position.  Using a  Kuglen hook through the side port and the [place] lens rotator through the 12  o'clock incision, the pupil, which measured only approximately 3 mm, was  stretched to 6 mm in size.  Using a bent 26-gauge needle on a Healon  syringe, a circular capsulorhexis was begun and then completed with the  Grabow forceps.  Hydrodissection and hydrodelineation were performed using  1% Xylocaine.  The 30 degree phacoemulsification tip was  then inserted with  slow controlled emulsification of the rather large nucleus.  Total  ultrasonic time 2 minutes 24 seconds, average power level 10%, total amount  of fluid used 105 mL.  Following removal of the nucleus, the residual cortex  was aspirated with the irrigation-aspiration tip.  A small amount of cortex  in the subincisional area was difficult to aspirate.  It was felt that  continued aspiration might cause a rupture of the intact posterior capsule,  and it was therefore elected to leave this tiny amount of cortex under the  iris.  The pupillary zone was completely clear.  The other cortex was  aspirated with the irrigation-aspiration tip.  The posterior capsule  appeared intact with a brilliant red fundus reflex.  It was therefore  elected to insert an [employer] [id number] silicone three-piece  posterior chamber intraocular lens implant, diopter strength +21.50.  This  was inserted with the McDonald forceps into the anterior chamber and then  centered into the capsular bag using the [facility] lens rotator.  The lens  appeared to be well-centered.  The Healon and  [place]ucoat which had been used  intermittently during the procedure were aspirated and replaced with  balanced salt solution and Miochol ophthalmic solution.  The incisions  appeared to be self-sealing.  It was, however, elected to place a single 10-  0 interrupted nylon suture across the 12 o'clock incision to ensure closure  and to prevent endophthalmitis.  Maxitrol ointment was instilled in the conjunctival cul-de-sac and a light  patch and protective shield applied to the operated left eye.  Duration of  procedure and anesthesia administration 45 minutes.  The patient tolerated  the procedure well in general, left the operating room for the recovery room  in good condition.       HNJ/MEDQ  D:  04/12/2004  T:  04/12/2004  Job:  [id number]

## 2010-11-25 NOTE — H&P (Signed)
[name], [name]               ACCOUNT NO.:  [id number]   MEDICAL RECORD NO.:  [id number]          PATIENT TYPE:  OIB   LOCATION:  2874                         FACILITY:  [facility]   PHYSICIAN:  Guadelupe Sabin, M.D.DATE OF BIRTH:  Jun 23, 1926   DATE OF ADMISSION:  04/12/2004  DATE OF DISCHARGE:  04/12/2004                                HISTORY & PHYSICAL   This was a planned outpatient readmission of this 77 year old black male  admitted for cataract implant surgery of the left eye.   PRESENT ILLNESS:  This patient has a long history of chronic open angle  glaucoma.  He has had previous laser surgery, trabeculoplasty performed on  the right eye September 18, 1990, left eye January 21, 1990, and right eye January 01, 1994.  He has gradually also developed cataract formation and was previously  admitted to this hospital on February 13, 2003 for cataract implant surgery of  the right eye.  The patient did well following this surgery with return of  vision to 20/25.  Meanwhile, the unoperated left eye has continued to  deteriorate to 20/60.  The patient therefore has requested similar cataract  implant surgery.  Patient was given oral discussion and printed information  concerning the procedure and its possible complications.  He signed an  informed consent and arrangements were made for his readmission at this  time.   PAST MEDICAL HISTORY:  Patient is in stable general health with chronic  arthritis of the right hip.  He currently is under the care of Dr. Alroy Dust.  Dr. Jodelle Green notes the patient's previous right bundle branch block,  controlled hypertension.  He is felt to be in satisfactory condition for the  proposed surgery under local anesthesia.   CURRENT MEDICATIONS:  1.  Indocin.  2.  Toprol.  3.  Hyzaar.  4.  Meperidine.  5.  Eye drops consisting of Travatan one drop at bedtime both eyes and      metoprolol one drop b.i.d. to both eyes.   REVIEW OF SYSTEMS:  No  cardiorespiratory complaints.   PHYSICAL EXAMINATION:  VITAL SIGNS:  As recorded on admission:  Heart rate  69, temperature 97, respirations 14, blood pressure 158/86.  GENERAL:  Patient is a pleasant, well-nourished, well-developed 77 year old  black male in no acute distress.  HEENT:  Eyes:  Visual acuity as noted above.  Applanation tonometry on  medication 20 mm each eye.  Slit lamp examination:  The eyes are white and  clear with a clear cornea, deep and clear anterior chamber.  A posterior  chamber implant is present in the right eye with a clear posterior capsule.  A nuclear cataract is present in the left eye.  Dilated detailed fundus  examination:  Patient dilates poorly with minimal dilation.  Right eye shows  a clear vitreous, attached retina, optic nerve disk cup ratio 0.5, blood  vessels, macula normal.  CHEST:  Lungs clear to percussion, auscultation.  HEART:  Normal sinus rhythm.  No cardiomegaly.  No murmurs.  ABDOMEN:  Negative.  EXTREMITIES:  Negative.   READMISSION DIAGNOSES:  1.  Senile cataract left eye.  2.  Pseudophakia right eye.  3.  Chronic open angle glaucoma both eyes.   SURGICAL PLAN:  Cataract implant surgery left eye.       HNJ/MEDQ  D:  04/12/2004  T:  04/12/2004  Job:  [id number]   cc:   Lonzo Cloud. Kriste Basque, M.D. [facility]

## 2010-11-25 NOTE — H&P (Signed)
NAMEARMARION, [name]                           ACCOUNT NO.:  [id number]   MEDICAL RECORD NO.:  [id number]                   PATIENT TYPE:  OIB   LOCATION:  2899                                 FACILITY:  [facility]   PHYSICIAN:  Guadelupe Sabin, M.D.             DATE OF BIRTH:  22-Apr-1926   DATE OF ADMISSION:  01/02/2003  DATE OF DISCHARGE:                                HISTORY & PHYSICAL   REASON FOR ADMISSION:  This was a planned outpatient surgical admission of  this 77 year old black male, admitted for cataract implant surgery of the  right eye.   HISTORY OF PRESENT ILLNESS:  This patient has been followed in my office  since July 18, 1989; at that time, the patient had a five-year history of  chronic glaucoma in both eyes and had been taking pilocarpine 4% one drop  four times per day, Betoptic one drop twice per day, and Pilogel at bedtime.  Patient was said to be allergic to NEPTAZANE glaucoma tablets.  Initial  examination revealed a visual acuity of 20/20 in each eye.  Gonioscopy  showed the angle to be opened with 3+ pigment, and pupils were extremely  small and miotic and difficult to see the optic nerve.  Pressure was  recorded at 26 mm in each eye.  The patient was placed on PROPINE ophthalmic  solution to lower the pressure further, but he developed any allergy to it  with redness and a skin rash.  Subsequently, the patient used Diamox Sequels  every 12 hours and had a laser trabeculoplasty performed on the left eye on  January 21, 1990, with 100 applications made, and subsequently a similar  trabeculoplasty of the right eye on September 18, 1990, and a second operation  on the right eye on January 02, 1994.  Patient tolerated these procedures well,  and pressure was only minimally reduced.  Gradual cataract formation  developed, and pressure eventually lowered to 21 to 22 range in each eye.  Cataract formation developed, further reducing his vision.  On July 20, 1997, an  additional laser trabeculoplasty was performed to the right eye by  Dr. Antony Contras, who was in my office at that time.  Visual fields showed  a nasal depression, but due to the miosis, it was difficult to evaluate  these fields.  As the cataract developed and the pupil was small, patient  had gradual deterioration in vision.  It was therefore elected to try a new  regimen, to discontinue the pilocarpine, and use optic Optipranolol and  Travatan; this lowered the pressure to 60 mm in each eye, and the pupil was  slightly larger.  Gradually, however, the right eye has deteriorated to  20/200 due to the cataract formation, and it is now felt advisable to  proceed with cataract implant surgery.  Patient was given all discussion and  printed information concerning the procedure and its  possible complications.  He signed an informed consent, and arrangements are made for his outpatient  admission at this time.   PAST MEDICAL HISTORY:  The patient is in stable health, under the care of  Dr. Alroy Dust.  Patient is felt to be in satisfactory condition for the  proposed surgery.   MEDICATIONS:  Patient is currently taking:  1. Cozaar.  2. Indocin.  3. An arthritis tablet.  4. He states he does not take insulin, does not take aspirin regularly.   REVIEW OF SYSTEMS:  No cardiorespiratory complaints.   PHYSICAL EXAMINATION:  GENERAL:  Patient is a pleasant, well-nourished, well-  developed black male in no acute distress.  VITAL SIGNS:  Temperature 98.4, pulse 99, respirations 18, blood pressure  180/93.  HEENT:  Eyes, visual acuity 20/100 to 20/200 right eye, 20/50 left eye.  Applanation tonometry, 17 mm each eye.  Slit-lamp examination, nuclear  cataract formation.  Fundus examination, clear vitreus, attached retina.  Optic nerve, disc-to-cup ratio 0.5 right eye, 0.6 left eye.  CHEST:  Lungs, clear to percussion and auscultation.  Heart, normal sinus  rhythm.  No cardiomegaly.  No  murmurs.  ABDOMEN:  Negative.  EXTREMITIES:  Negative.   ADMISSION DIAGNOSES:  1. Senile nuclear cataract, both eyes, right greater than left.  2. Chronic open-angle glaucoma, both eyes.  3. Cataract, both eyes.   SURGICAL PLAN:  Cataract implant surgery, right eye now, left eye later as  needed.                                               Guadelupe Sabin, M.D.    HNJ/MEDQ  D:  01/02/2003  T:  01/03/2003  Job:  [id number]

## 2010-11-25 NOTE — Op Note (Signed)
NAMEELENA, Evans                           ACCOUNT NO.:  [id number]   MEDICAL RECORD NO.:  [id number]                   PATIENT TYPE:  OIB   LOCATION:  2899                                 FACILITY:  [facility]   PHYSICIAN:  Guadelupe Sabin, M.D.             DATE OF BIRTH:  [birth year]   DATE OF PROCEDURE:  02/13/2003  DATE OF DISCHARGE:  02/13/2003                                 OPERATIVE REPORT   OPERATION:  Planned extracapsular cataract extraction -  phacoemulsification, primary insertion of posterior chamber intraocular lens  implant.   SURGEON:  Guadelupe Sabin, M.D.   ASSISTANT:  Nurse anesthetist.   ANESTHESIA:  Local 4% Xylocaine, sodium pentothal intravenously during the  period of retrobulbar blocking. Additional anesthesia consisted of the  retrobulbar block of 4% Xylocaine and 0.75 Marcaine with Wydase added,  topical Tetracaine, intraocular Xylocaine.   OPERATIVE PROCEDURE:  After the patient was prepped and draped a lid  speculum was inserted in the right eye.  Schiotz  tonometry was recorded at  5 scale units with a 5.5 gram weight.  A superior rectus traction suture was  placed.  A peritomy was performed at the limbus from the 11 to 1 o'clock  position.  The corneoscleral junction was cleaned and a corneoscleral groove  made with a #45 degree super blade.  The anterior chamber was then entered  with the 2.5 mm diamond keratome at the 12 o'clock position and a #15 degree  at the 2:30 position.  Pupil dilation was approximately five to six mm.  An  anterior capsulotomy was begun with the #26 gauge needle on a Hellin  syringe, this was completed with the Grabow forceps.  Hydrodissection and  hydrodelineation were performed using 1% Xylocaine.  The rather mature  nucleus was then rotated in the capsular bag.  The 30 degree  phacoemulsification tip was then inserted with slow controlled  emulsification of the rather large nucleus.  Total ultrasonic time one  minute and thirty-one seconds, average power level 16%.  Total amount of  fluid used: 105 cc.  Following removal of the nucleus the residual cortex  and epinucleus was removed with the irrigation aspiration tip.  The  posterior capsule appeared intact, there was a very thin film of cortex left  on the nasal side of the right eye which could not be aspirated without fear  of breaking the capsule.  It was therefore elected to let this absorb in the  postoperative period.  An intraocular lens implant was then inserted,  [employer] model SI40ND, dilatory strength +21.00.  This was  inserted into the anterior chamber with a McDonald forceps and then centered  into the capsular bag using the [facility] lens rotator.  The lens appeared to  be well centered.  The Hellin which had been used throughout the procedure  was aspirated and replaced with balanced salt solution and Miochol  ophthalmic solution.  The operative incision was closed with a single 10-0  interrupted nylon suture at the 12 o'clock position.  The other incision  temporally appeared to be self sealing.  Maxitrol ointment and Pilopine  ophthalmic ointment were instilled in the conjunctival cul-de-sac.  A light  patch and protector shield were applied to the operated right eye.  Duration  of procedure and anesthesia administration 45 minutes.  The patient  tolerated the procedure well in general and left the operating room for the  recovery room in good condition.                                               Guadelupe Sabin, M.D.    HNJ/MEDQ  D:  02/13/2003  T:  02/13/2003  Job:  [id number]

## 2010-11-25 NOTE — H&P (Signed)
NAMESALOME, COZBY                           ACCOUNT NO.:  [id number]   MEDICAL RECORD NO.:  [id number]                   PATIENT TYPE:  OIB   LOCATION:  2899                                 FACILITY:  [facility]   PHYSICIAN:  Guadelupe Sabin, M.D.             DATE OF BIRTH:  06-Jan-1926   DATE OF ADMISSION:  02/13/2003  DATE OF DISCHARGE:  02/13/2003                                HISTORY & PHYSICAL   DATE OF OUTPATIENT SURGERY:  February 13, 2003.   OUTPATIENT NOTE:  This was a planned outpatient readmission of this 77-year-  old black male admitted for cataract implant surgery of the right eye.   HISTORY OF PRESENT ILLNESS:  This patient has a long history of chronic open  angle glaucoma in both eyes difficult to control. He has undergone previous  laser trabeculoplasty of the left eye January 21, 1990, right eye September 23, 1990, right eye January 02, 1994.  The patient is presently controlled on  topical medications including Travatan ophthalmic solution and Optipranolol.  He has been noted to have progressive cataract formation in both eyes,  greater in the right than left eye, and the patient was previously admitted  for cataract implant surgery of the right eye on January 02, 2003.  A  retrobulbar hemorrhage occurred following the retrobulbar anesthesia and  surgery was canceled.  The patient is now readmitted for the same cataract  implant surgical procedure.   PAST MEDICAL HISTORY:  Stable general health.   REVIEW OF SYSTEMS:  No cardiorespiratory complaints.   PHYSICAL EXAMINATION:  VITAL SIGNS: As recorded on admission, blood pressure  153/89, respirations 18, pulse 81, temperature 98.6.  GENERAL: The patient is a pleasant, well-developed, well-nourished black  male in no acute distress.  HEENT: Eyes - visual acuity with best correction 20/100 right eye, 20/40  left eye.  Slit lamp examination - the eyes are wide and clear, the cornea  shows peripheral arcus senilis, the central  cornea is clear, the anterior  chamber is deep and clear.  Nuclear cataract formation is present greater in  the right than left eye.  Detailed fundus examination is somewhat difficult  in this patient as his pupils dilate poorly.  He has been pre-dilated for  surgery with Scopolamine ophthalmic solution 0.25% one drop four times a  day.  Pupil size is approximately 5 mm.  Fundus examination shows a clear  vitreus, attached retina, the optic nerve is sharply outlined, of good color  with a disk cup ratio of 0.4.  CHEST: Lungs clear to percussion and auscultation.  HEART: Normal sinus rhythm, no cardiomegaly, no murmurs.  ABDOMEN: Negative.  EXTREMITIES: Negative.    ADMISSION DIAGNOSES:  1. Senile nuclear cataract both eyes.  2. Chronic open angle glaucoma both eyes.   SURGICAL PLAN:  Cataract implant surgery right eye now, left eye as needed.  Guadelupe Sabin, M.D.    HNJ/MEDQ  D:  02/13/2003  T:  02/13/2003  Job:  [id number]

## 2010-11-25 NOTE — Op Note (Signed)
   [name], [name]                           ACCOUNT NO.:  [id number]   MEDICAL RECORD NO.:  [id number]                   PATIENT TYPE:  OIB   LOCATION:  2899                                 FACILITY:  [facility]   PHYSICIAN:  Guadelupe Sabin, M.D.             DATE OF BIRTH:  04-23-1926   DATE OF PROCEDURE:  01/02/2003  DATE OF DISCHARGE:                                 OPERATIVE REPORT   PREOPERATIVE DIAGNOSES:  1. Senile cataract, right eye.  2. Chronic open-angle glaucoma, right eye.   POSTOPERATIVE DIAGNOSES:  1. Senile cataract, right eye.  2. Chronic open-angle glaucoma, right eye.   ANESTHESIA:  Patient was prepped and draped, and a retrobulbar injection of  0.75 Marcaine and 4% Xylocaine was injected in the retrobulbar space.  Topical tetracaine was also applied.  It was noted, however, that there was  a sudden increase in the orbital pressure, intraocular pressure, and there  appeared to be a retrobulbar and subconjunctival hemorrhage.  The eye was  massaged temporarily.  The ocular microscope was aligned.  Schiotz's  tonometry was then carried out, revealing a scale reading of only 1 to 2  scale units, indicating a high intraocular pressure.  Due to the bulging  conjunctival hemorrhage, retrobulbar hemorrhage, it was felt dangerous to  proceed with the proposed surgery.  It was therefore elected to cancel the  surgery for rescheduling in approximately four to six weeks.                                               Guadelupe Sabin, M.D.    HNJ/MEDQ  D:  01/02/2003  T:  01/03/2003  Job:  [id number]

## 2011-02-16 ENCOUNTER — Ambulatory Visit (INDEPENDENT_AMBULATORY_CARE_PROVIDER_SITE_OTHER): Payer: Medicare Other | Admitting: Pulmonary Disease

## 2011-02-16 ENCOUNTER — Other Ambulatory Visit: Payer: Self-pay | Admitting: Pulmonary Disease

## 2011-02-16 ENCOUNTER — Other Ambulatory Visit (INDEPENDENT_AMBULATORY_CARE_PROVIDER_SITE_OTHER): Payer: Medicare Other

## 2011-02-16 ENCOUNTER — Encounter: Payer: Self-pay | Admitting: Pulmonary Disease

## 2011-02-16 ENCOUNTER — Ambulatory Visit (INDEPENDENT_AMBULATORY_CARE_PROVIDER_SITE_OTHER)
Admission: RE | Admit: 2011-02-16 | Discharge: 2011-02-16 | Disposition: A | Discharge status: Home / Self Care | Payer: Medicare Other | Source: Ambulatory Visit | Attending: Pulmonary Disease | Admitting: Pulmonary Disease

## 2011-02-16 DIAGNOSIS — I4510 Unspecified right bundle-branch block: Secondary | ICD-10-CM

## 2011-02-16 DIAGNOSIS — E7800 Pure hypercholesterolemia, unspecified: Secondary | ICD-10-CM

## 2011-02-16 DIAGNOSIS — I10 Essential (primary) hypertension: Secondary | ICD-10-CM

## 2011-02-16 DIAGNOSIS — F17200 Nicotine dependence, unspecified, uncomplicated: Secondary | ICD-10-CM

## 2011-02-16 DIAGNOSIS — Z0000 Encounter for general adult medical examination without abnormal findings: Secondary | ICD-10-CM

## 2011-02-16 DIAGNOSIS — E663 Overweight: Secondary | ICD-10-CM

## 2011-02-16 DIAGNOSIS — N139 Obstructive and reflux uropathy, unspecified: Secondary | ICD-10-CM

## 2011-02-16 DIAGNOSIS — M545 Low back pain: Secondary | ICD-10-CM

## 2011-02-16 DIAGNOSIS — M1990 Unspecified osteoarthritis, unspecified site: Secondary | ICD-10-CM

## 2011-02-16 LAB — CBC WITH DIFFERENTIAL/PLATELET
Basophils Absolute: 0 10*3/uL (ref 0.0–0.1)
Basophils Relative: 0.5 % (ref 0.0–3.0)
Eosinophils Absolute: 0.4 10*3/uL (ref 0.0–0.7)
HCT: 39.5 % (ref 39.0–52.0)
Hemoglobin: 13.3 g/dL (ref 13.0–17.0)
Lymphs Abs: 2.3 10*3/uL (ref 0.7–4.0)
MCHC: 33.7 g/dL (ref 30.0–36.0)
Neutro Abs: 2.3 10*3/uL (ref 1.4–7.7)
RBC: 3.92 Mil/uL — ABNORMAL LOW (ref 4.22–5.81)
RDW: 13.2 % (ref 11.5–14.6)

## 2011-02-16 LAB — HEPATIC FUNCTION PANEL
Alkaline Phosphatase: 51 U/L (ref 39–117)
Bilirubin, Direct: 0.1 mg/dL (ref 0.0–0.3)
Total Bilirubin: 0.9 mg/dL (ref 0.3–1.2)

## 2011-02-16 LAB — BASIC METABOLIC PANEL
Calcium: 9.2 mg/dL (ref 8.4–10.5)
GFR: 83.05 mL/min (ref >60.00)
Sodium: 138 mEq/L (ref 135–145)

## 2011-02-16 LAB — LIPID PANEL
HDL: 46.4 mg/dL (ref >39.00)
LDL Cholesterol: 67 mg/dL (ref 0–99)
Total CHOL/HDL Ratio: 3

## 2011-02-16 LAB — PSA: PSA: 0.46 ng/mL (ref 0.10–4.00)

## 2011-02-16 NOTE — Patient Instructions (Signed)
Today we updated your med list in EPIC...    Continue your current meds the same...  Today we did your follow up CXR & fasting blood work...    Please call the [facility]REE in a few days for your results...    Dial [id number] & when prompted enter your patient number followed by the # symbol...    Your patient number is:   [id number]  Collect the stool specimens & mail back to [place] so we can check for hidden blood...  Let's get on track w our DIET & EXERCISE program!!!  Call for any questions...  Let's plan a follow up visit in 6 months.Marland KitchenMarland Kitchen

## 2011-02-16 NOTE — Progress Notes (Signed)
Subjective:    Patient ID: [name], male    DOB: 08-29-1925, 77 y.o.   MRN: [id number]  HPI 77 y/o BM here for a follow up visit...  he has multiple medical problems as noted below...    ~  February 14, 2010:  another stable 27mo- no new complaints or concerns... his wife was Dx w/ colon cancer- s/p surg & apparently one pos node> incr stress for the family...  BP remains controlled;  Chol has been good on the Simva40;  weight stable;  atthritis reasonable w/ Tramadol Prn;  interestingly> he still refuses colonoscopy despite wife's dx w/ colon ca... he will ret for FASTING labs.  ~  August 15, 2010:  he continues to do well- no new complaints or concerns... still smoking pipe regularly & not interested in smoking cessation help;  BP controlled on meds;  Chol has been good on Simva40;  his CC remains arthritis pains & Tramadol helps;  he continues to refuse colonoscopy & states he's doing well w/o pain, constip, blood, etc... OK refills for 2012.  ~  February 16, 2011:  27mo ROV & he remains stable, no new complaints or concerns;  Still smoking his pipe & CXR today shows scarring at the bases, otherw ok;  He denies CP, palpit, ch in SOB, edema> but he is too sedentary & weight 212# w/ BMI 34 (we reviewed diet & exercise);  Chol looks good on Simva40, other labs OK.   Problem List:  GLAUCOMA (ICD-365.9) - on gtts per [facility]...  PIPE SMOKER (ICD-305.1) - he continues to consume 1 pouch of cherry blend pipe tobacco weekly & still not interested in smoking cessation help...  HYPERTENSION (ICD-401.9) - controlled on TOPROL XL 50mg /d and HYZAAR 50/d... BP=118/78 doing well- denies HA, fatigue, visual changes, CP, palipit, dizziness, syncope, dyspnea, edema, etc... ~  CXR 8/10 showed norm heart size, sl incr markings, NAD.Marland Kitchen. ~  CXR 8/12 showed basilar scarring, heart at upper limits norm size, DJD in TSpine & shoulders, NAD...  RIGHT BUNDLE BRANCH BLOCK (ICD-426.4) - on ASA 81mg /d... old  EKG's w/ RBBB, otherw normal... as noted- no CP, palpit, SOB, etc...  HYPERCHOLESTEROLEMIA (ICD-272.0) - on SIMVASTATIN 40mg /d + diet...  ~  FLP 8/08 showed TChol 124, TG 56, HDL 42, LDL 71... continue same Simva40. ~  FLP 8/09 showed TChol 112, TG 72, HDL 30, LDL 68 ~  FLP 8/10 showed TChol 137, TG 85, HDL 44, LDL 76... rec incr exerc, get wt down. ~  FLP 8/11 showed TChol 133, TG 61, HDL 43, LDL 77 ~  FLP 8/12 showed TChol 129, TG 79, HDL 46, LDL 67... Continue Simva40.  OVERWEIGHT (ICD-278.02) - discussed diet + exercise program... ~  weight 8/09 = 202# ~  weight 2/10 = 211#... he knows that he needs to do better. ~  weight 8/10 = 210# ~  weight 2/11 = 209# ~  weight 8/11 = 211#... we reviewed diet & exercise. ~  weight 2/12 = 215# ~  Weight 8/12 = 212#... 5'6"tall... BMI=34 & discussed w/ pt.  Hx of GERD (ICD-530.81) - no recent symptoms... doing satis overall... we discussed Prilosec vs Zantac OTC Prn.  DEGENERATIVE JOINT DISEASE (ICD-715.90) - using Tramadol Prn (ave 1/d)... he notes some wrist pain- ACE wrap + Tramadol helps.  BACK PAIN, LUMBAR (ICD-724.2)  Hx of ABNORMALITY OF GAIT >> "I get around pretty good" & denies prob w/ ADLs, mows yard, & does housework...  Hx of  ANGIOEDEMA (ICD-995.1) - ? etiology... no recurrence.  SURG/OTH PROC NOT CARRIED OUT BECAUSE PTS DECN (ICD-V64.2) - discussed again... he continues to refuse routine colonoscopy despite his wife's Dx w/ colon cancer 5/11...  Health Maintenance - he refuses the Flu vaccines but had TETANUS shot & PNEUMOVAX Feb2010... He also refuses DRE "as long as I'm doin all right, don't mess w/ it" and his PSA 8/12 = 0.46   Past Surgical History  Procedure Date  . Cataract surgery 2004/2005    Dr. Cecilie Kicks    Outpatient Encounter Prescriptions as of 02/16/2011  Medication Sig Dispense Refill  . aspirin 81 MG tablet Take 81 mg by mouth daily.        . dorzolamide-timolol (COSOPT) 22.3-6.8 MG/ML ophthalmic solution 1  drop as needed.        Marland Kitchen losartan-hydrochlorothiazide (HYZAAR) 50-12.5 MG per tablet Take 1 tablet by mouth daily.        . metoprolol (TOPROL-XL) 50 MG 24 hr tablet Take 50 mg by mouth daily.        . simvastatin (ZOCOR) 40 MG tablet Take 40 mg by mouth at bedtime.        . traMADol (ULTRAM) 50 MG tablet Take 50 mg by mouth every 4 (four) hours as needed.        . travoprost, benzalkonium, (TRAVATAN) 0.004 % ophthalmic solution 1 drop as needed.          Allergies  Allergen Reactions  . Iodine     REACTION: rash---whleps    Current Medications, Allergies, Past Medical History, Past Surgical History, Family History, and Social History were reviewed in [employer] record.    Review of Systems    See HPI - all other systems neg except as noted...  The patient complains of dyspnea on exertion and difficulty walking.  The patient denies anorexia, fever, weight loss, weight gain, vision loss, decreased hearing, hoarseness, chest pain, syncope, peripheral edema, prolonged cough, headaches, hemoptysis, abdominal pain, melena, hematochezia, severe indigestion/heartburn, hematuria, incontinence, muscle weakness, suspicious skin lesions, transient blindness, depression, unusual weight change, abnormal bleeding, enlarged lymph nodes, and any signs of recurrent angioedema.     Objective:   Physical Exam    WD, WN, 77 y/o BM in NAD... GENERAL:  Alert & oriented; pleasant & cooperative... HEENT:  [place]/AT, EOM-wnl, PERRLA, EACs-clear, TMs-wnl, NOSE-clear, THROAT-clear & wnl. NECK:  Supple w/ fairROM; no JVD; normal carotid impulses w/o bruits; no thyromegaly or nodules palpated; no lymphadenopathy. CHEST:  Clear to P & A; without wheezes/ rales/ or rhonchi heard...  HEART:  Regular Rhythm; without murmurs/ rubs/ or gallops detected... ABDOMEN:  Obese, soft & nontender; normal bowel sounds; no organomegaly or masses palpated... EXT: without deformities, mod arthritic changes; no  varicose veins/ venous insuffic/ or edema. NEURO:  CN's intact; +gait abn;  no focal defects... DERM:  No lesions noted; no rash etc...   Assessment & Plan:   PIPE SMOKER/ Underlying COPD, Chr Bronchitis>  He is not about to quit his pipe; offered smoking cessation counseling & med help but he is not interested; f/u CXR w/ bibasilar scarring but NAD...  HBP>  Controlled on ToprolXL & Hyzaar, continue same...  RBBB>  He denies CP, palpit, ch in SOB, etc;  Needs better diet/ exercise...  CHOL>  Stable on Simva40 + diet efforts...  Overweight>  Must get wt down & we reviewed diet & exercise...  DJD>  He has Tramadol for as needed use...  Health Maintenance> he has  repeatedly refused DRE & colonoscopy.Marland KitchenMarland Kitchen

## 2011-02-18 ENCOUNTER — Encounter: Payer: Self-pay | Admitting: Pulmonary Disease

## 2011-08-24 ENCOUNTER — Ambulatory Visit (INDEPENDENT_AMBULATORY_CARE_PROVIDER_SITE_OTHER): Payer: Medicare Other | Admitting: Pulmonary Disease

## 2011-08-24 ENCOUNTER — Encounter: Payer: Self-pay | Admitting: Pulmonary Disease

## 2011-08-24 DIAGNOSIS — E663 Overweight: Secondary | ICD-10-CM

## 2011-08-24 DIAGNOSIS — I10 Essential (primary) hypertension: Secondary | ICD-10-CM

## 2011-08-24 DIAGNOSIS — I4510 Unspecified right bundle-branch block: Secondary | ICD-10-CM

## 2011-08-24 DIAGNOSIS — E7800 Pure hypercholesterolemia, unspecified: Secondary | ICD-10-CM | POA: Diagnosis not present

## 2011-08-24 DIAGNOSIS — M1990 Unspecified osteoarthritis, unspecified site: Secondary | ICD-10-CM

## 2011-08-24 DIAGNOSIS — M545 Low back pain: Secondary | ICD-10-CM

## 2011-08-24 MED ORDER — METOPROLOL SUCCINATE ER 50 MG PO TB24: 50.0000 mg | ORAL_TABLET | Freq: Every day | ORAL | Status: DC

## 2011-08-24 MED ORDER — SIMVASTATIN 40 MG PO TABS: 40.0000 mg | ORAL_TABLET | Freq: Every day | ORAL | Status: DC

## 2011-08-24 MED ORDER — LOSARTAN POTASSIUM-HCTZ 50-12.5 MG PO TABS: 1.0000 | ORAL_TABLET | Freq: Every day | ORAL | Status: DC

## 2011-08-24 MED ORDER — TRAMADOL HCL 50 MG PO TABS: 50.0000 mg | ORAL_TABLET | Freq: Four times a day (QID) | ORAL | Status: DC | PRN

## 2011-08-24 NOTE — Patient Instructions (Signed)
Today we updated your med list in our EPIC system...    Continue your current medications the same...    We refilled your meds per request...  We reviewed your previous XRays & blood work...    We will plan to recheck your Fasting blood work in 6 months...  Stay as active as possible & work on wt reduction...call for any questions...  Let's plan a follow up visit in 6 months.Marland KitchenMarland Kitchen

## 2011-08-24 NOTE — Progress Notes (Signed)
Subjective:    Patient ID: [name], male    DOB: 1926/02/27, 78 y.o.   MRN: [id number]  HPI 78 y/o BM here for a follow up visit...  he has multiple medical problems as noted below...    ~  August 15, 2010:  he continues to do well- no new complaints or concerns... still smoking pipe regularly & not interested in smoking cessation help;  BP controlled on meds;  Chol has been good on Simva40;  his CC remains arthritis pains & Tramadol helps;  he continues to refuse colonoscopy & states he's doing well w/o pain, constip, blood, etc... OK refills for 2012.  ~  February 16, 2011:  547mo ROV & he remains stable, no new complaints or concerns;  Still smoking his pipe & CXR today shows scarring at the bases, otherw ok;  He denies CP, palpit, ch in SOB, edema> but he is too sedentary & weight 212# w/ BMI 34 (we reviewed diet & exercise);  Chol looks good on Simva40, other labs OK.  ~  August 24, 2011:  547mo ROV & Cortez is doing remarkably well at age 75, no new complaints of concerns today...  We reviewed his med bottles today & gave him written refill prescriptions... He declines the season flu vaccines...    Smoker> still consumes one pouch pipe tobacco wkly & asked to cut way back/ quit...    HBP, RBBB> on ASA, MetoprololER50, Hyzaar50-12.5; BP= 122/70 & he feels well & denies CP, palpit, dizzy, SOB, edema, etc...    Chol> on Simva40 + diet rx; FLP check each Aug & all good, at goal, tol rx well, etc...    DJD, LBP> on Tramadol as needed; "I get around pretty good" he says...    Note> he has continued to refuse screening colonoscopy or DRE, but PSA has been wnl...   PROBLEM LIST:   GLAUCOMA (ICD-365.9) - on gtts per [facility]...  PIPE SMOKER (ICD-305.1) - he continues to consume 1 pouch of cherry blend pipe tobacco weekly & still not interested in smoking cessation help...  HYPERTENSION (ICD-401.9) - controlled on TOPROL XL 50mg /d and HYZAAR 50/d... BP=118/78 doing well- denies HA,  fatigue, visual changes, CP, palipit, dizziness, syncope, dyspnea, edema, etc... ~  CXR 8/10 showed norm heart size, sl incr markings, NAD.Marland Kitchen. ~  CXR 8/12 showed basilar scarring, heart at upper limits norm size, DJD in TSpine & shoulders, NAD...  RIGHT BUNDLE BRANCH BLOCK (ICD-426.4) - on ASA 81mg /d... old EKG's w/ RBBB, otherw normal... as noted- no CP, palpit, SOB, etc...  HYPERCHOLESTEROLEMIA (ICD-272.0) - on SIMVASTATIN 40mg /d + diet...  ~  FLP 8/08 showed TChol 124, TG 56, HDL 42, LDL 71... continue same Simva40. ~  FLP 8/09 showed TChol 112, TG 72, HDL 30, LDL 68 ~  FLP 8/10 showed TChol 137, TG 85, HDL 44, LDL 76... rec incr exerc, get wt down. ~  FLP 8/11 showed TChol 133, TG 61, HDL 43, LDL 77 ~  FLP 8/12 showed TChol 129, TG 79, HDL 46, LDL 67... Continue Simva40.  OVERWEIGHT (ICD-278.02) - discussed diet + exercise program... ~  weight 8/09 = 202# ~  weight 2/10 = 211#... he knows that he needs to do better. ~  weight 8/10 = 210# ~  weight 2/11 = 209# ~  weight 8/11 = 211#... we reviewed diet & exercise. ~  weight 2/12 = 215# ~  Weight 8/12 = 212#... 5'6"tall... BMI=34 & discussed w/ pt. ~  Weight  2/13 = 207#  Hx of GERD (ICD-530.81) - no recent symptoms... doing satis overall... we discussed Prilosec vs Zantac OTC Prn.  DEGENERATIVE JOINT DISEASE (ICD-715.90) - using Tramadol Prn (ave 1/d)... he notes some wrist pain- ACE wrap + Tramadol helps.  BACK PAIN, LUMBAR (ICD-724.2)  Hx of ABNORMALITY OF GAIT >> "I get around pretty good" & denies prob w/ ADLs, mows yard, & does housework...  Hx of ANGIOEDEMA (ICD-995.1) - ? etiology... no recurrence.  SURG/OTH PROC NOT CARRIED OUT BECAUSE PTS DECN (ICD-V64.2) - discussed again... he continues to refuse routine colonoscopy despite his wife's Dx w/ colon cancer 5/11...  Health Maintenance - he refuses the Flu vaccines but had TETANUS shot & PNEUMOVAX Feb2010... He also refuses DRE "as long as I'm doin all right, don't mess w/  it" and his PSA 8/12 = 0.46   Past Surgical History  Procedure Date  . Cataract surgery 2004/2005    Dr. Cecilie Kicks    Outpatient Encounter Prescriptions as of 08/24/2011  Medication Sig Dispense Refill  . aspirin 81 MG tablet Take 81 mg by mouth daily.        . brimonidine (ALPHAGAN) 0.2 % ophthalmic solution Place 1 drop into both eyes at bedtime.      . dorzolamide-timolol (COSOPT) 22.3-6.8 MG/ML ophthalmic solution Place 1 drop into both eyes 2 (two) times daily.       Marland Kitchen latanoprost (XALATAN) 0.005 % ophthalmic solution Place 1 drop into both eyes at bedtime.      Marland Kitchen losartan-hydrochlorothiazide (HYZAAR) 50-12.5 MG per tablet Take 1 tablet by mouth daily.        . metoprolol (TOPROL-XL) 50 MG 24 hr tablet Take 50 mg by mouth daily.        . simvastatin (ZOCOR) 40 MG tablet Take 40 mg by mouth at bedtime.        . traMADol (ULTRAM) 50 MG tablet Take 50 mg by mouth every 6 (six) hours as needed.       Marland Kitchen DISCONTD: travoprost, benzalkonium, (TRAVATAN) 0.004 % ophthalmic solution 1 drop as needed.          Allergies  Allergen Reactions  . Iodine     REACTION: rash---whleps    Current Medications, Allergies, Past Medical History, Past Surgical History, Family History, and Social History were reviewed in [employer] record.    Review of Systems    See HPI - all other systems neg except as noted...  The patient complains of dyspnea on exertion and difficulty walking.  The patient denies anorexia, fever, weight loss, weight gain, vision loss, decreased hearing, hoarseness, chest pain, syncope, peripheral edema, prolonged cough, headaches, hemoptysis, abdominal pain, melena, hematochezia, severe indigestion/heartburn, hematuria, incontinence, muscle weakness, suspicious skin lesions, transient blindness, depression, unusual weight change, abnormal bleeding, enlarged lymph nodes, and any signs of recurrent angioedema.     Objective:   Physical Exam    WD, WN, 78 y/o  BM in NAD... GENERAL:  Alert & oriented; pleasant & cooperative... HEENT:  [place]/AT, EOM-wnl, PERRLA, EACs-clear, TMs-wnl, NOSE-clear, THROAT-clear & wnl. NECK:  Supple w/ fairROM; no JVD; normal carotid impulses w/o bruits; no thyromegaly or nodules palpated; no lymphadenopathy. CHEST:  Clear to P & A; without wheezes/ rales/ or rhonchi heard...  HEART:  Regular Rhythm; without murmurs/ rubs/ or gallops detected... ABDOMEN:  Obese, soft & nontender; normal bowel sounds; no organomegaly or masses palpated... EXT: without deformities, mod arthritic changes; no varicose veins/ venous insuffic/ or edema. NEURO:  CN's intact; +  gait abn;  no focal defects... DERM:  No lesions noted; no rash etc...  RADIOLOGY DATA:  Reviewed in the EPIC EMR & discussed w/ the patient...  LABORATORY DATA:  Reviewed in the EPIC EMR & discussed w/ the patient...   Assessment & Plan:   PIPE SMOKER/ Underlying COPD, Chr Bronchitis>  He is not about to quit his pipe; offered smoking cessation counseling & med help but he is not interested; f/u CXR w/ bibasilar scarring but NAD...  HBP>  Controlled on ToprolXL & Hyzaar, continue same...  RBBB>  He denies CP, palpit, ch in SOB, etc;  Needs better diet/ exercise...  CHOL>  Stable on Simva40 + diet efforts...  Overweight>  Must get wt down & we reviewed diet & exercise...  DJD>  He has Tramadol for as needed use...  Health Maintenance> he has repeatedly refused DRE & colonoscopy...    Patient's Medications  New Prescriptions   No medications on file  Previous Medications   ASPIRIN 81 MG TABLET    Take 81 mg by mouth daily.     BRIMONIDINE (ALPHAGAN) 0.2 % OPHTHALMIC SOLUTION    Place 1 drop into both eyes at bedtime.   DORZOLAMIDE-TIMOLOL (COSOPT) 22.3-6.8 MG/ML OPHTHALMIC SOLUTION    Place 1 drop into both eyes 2 (two) times daily.    LATANOPROST (XALATAN) 0.005 % OPHTHALMIC SOLUTION    Place 1 drop into both eyes at bedtime.  Modified Medications   Modified  Medication Previous Medication   LOSARTAN-HYDROCHLOROTHIAZIDE (HYZAAR) 50-12.5 MG PER TABLET losartan-hydrochlorothiazide (HYZAAR) 50-12.5 MG per tablet      Take 1 tablet by mouth daily.    Take 1 tablet by mouth daily.     METOPROLOL SUCCINATE (TOPROL-XL) 50 MG 24 HR TABLET metoprolol (TOPROL-XL) 50 MG 24 hr tablet      Take 1 tablet (50 mg total) by mouth daily.    Take 50 mg by mouth daily.     SIMVASTATIN (ZOCOR) 40 MG TABLET simvastatin (ZOCOR) 40 MG tablet      Take 1 tablet (40 mg total) by mouth at bedtime.    Take 40 mg by mouth at bedtime.     TRAMADOL (ULTRAM) 50 MG TABLET traMADol (ULTRAM) 50 MG tablet      Take 1 tablet (50 mg total) by mouth every 6 (six) hours as needed.    Take 50 mg by mouth every 6 (six) hours as needed.   Discontinued Medications   TRAVOPROST, BENZALKONIUM, (TRAVATAN) 0.004 % OPHTHALMIC SOLUTION    1 drop as needed.

## 2011-10-23 DIAGNOSIS — H409 Unspecified glaucoma: Secondary | ICD-10-CM | POA: Diagnosis not present

## 2011-10-23 DIAGNOSIS — H4011X0 Primary open-angle glaucoma, stage unspecified: Secondary | ICD-10-CM | POA: Diagnosis not present

## 2011-10-23 DIAGNOSIS — H0019 Chalazion unspecified eye, unspecified eyelid: Secondary | ICD-10-CM | POA: Diagnosis not present

## 2012-01-03 DIAGNOSIS — H409 Unspecified glaucoma: Secondary | ICD-10-CM | POA: Diagnosis not present

## 2012-01-03 DIAGNOSIS — H4011X0 Primary open-angle glaucoma, stage unspecified: Secondary | ICD-10-CM | POA: Diagnosis not present

## 2012-01-08 DIAGNOSIS — H4011X0 Primary open-angle glaucoma, stage unspecified: Secondary | ICD-10-CM | POA: Diagnosis not present

## 2012-01-08 DIAGNOSIS — H409 Unspecified glaucoma: Secondary | ICD-10-CM | POA: Diagnosis not present

## 2012-02-23 ENCOUNTER — Ambulatory Visit (INDEPENDENT_AMBULATORY_CARE_PROVIDER_SITE_OTHER)
Admission: RE | Admit: 2012-02-23 | Discharge: 2012-02-23 | Disposition: A | Discharge status: Home / Self Care | Payer: Medicare Other | Source: Ambulatory Visit | Attending: Pulmonary Disease | Admitting: Pulmonary Disease

## 2012-02-23 ENCOUNTER — Ambulatory Visit (INDEPENDENT_AMBULATORY_CARE_PROVIDER_SITE_OTHER): Payer: Medicare Other | Admitting: Pulmonary Disease

## 2012-02-23 ENCOUNTER — Other Ambulatory Visit (INDEPENDENT_AMBULATORY_CARE_PROVIDER_SITE_OTHER): Payer: Medicare Other

## 2012-02-23 ENCOUNTER — Encounter: Payer: Self-pay | Admitting: Pulmonary Disease

## 2012-02-23 VITALS — BP 118/72 | HR 48 | Temp 97.0°F | Ht 66.0 in | Wt 206.4 lb

## 2012-02-23 DIAGNOSIS — E118 Type 2 diabetes mellitus with unspecified complications: Secondary | ICD-10-CM | POA: Insufficient documentation

## 2012-02-23 DIAGNOSIS — F419 Anxiety disorder, unspecified: Secondary | ICD-10-CM

## 2012-02-23 DIAGNOSIS — I10 Essential (primary) hypertension: Secondary | ICD-10-CM

## 2012-02-23 DIAGNOSIS — R7301 Impaired fasting glucose: Secondary | ICD-10-CM

## 2012-02-23 DIAGNOSIS — E7800 Pure hypercholesterolemia, unspecified: Secondary | ICD-10-CM

## 2012-02-23 DIAGNOSIS — I4510 Unspecified right bundle-branch block: Secondary | ICD-10-CM

## 2012-02-23 DIAGNOSIS — F17200 Nicotine dependence, unspecified, uncomplicated: Secondary | ICD-10-CM | POA: Diagnosis not present

## 2012-02-23 DIAGNOSIS — E663 Overweight: Secondary | ICD-10-CM

## 2012-02-23 DIAGNOSIS — F411 Generalized anxiety disorder: Secondary | ICD-10-CM | POA: Diagnosis not present

## 2012-02-23 DIAGNOSIS — M1990 Unspecified osteoarthritis, unspecified site: Secondary | ICD-10-CM

## 2012-02-23 DIAGNOSIS — M545 Low back pain: Secondary | ICD-10-CM

## 2012-02-23 LAB — BASIC METABOLIC PANEL
BUN: 15 mg/dL (ref 6–23)
CO2: 24 mEq/L (ref 19–32)
Calcium: 9.3 mg/dL (ref 8.4–10.5)
Glucose, Bld: 106 mg/dL — ABNORMAL HIGH (ref 70–99)
Sodium: 137 mEq/L (ref 135–145)

## 2012-02-23 LAB — CBC WITH DIFFERENTIAL/PLATELET
Basophils Absolute: 0 10*3/uL (ref 0.0–0.1)
Eosinophils Absolute: 0.4 10*3/uL (ref 0.0–0.7)
Lymphocytes Relative: 39.7 % (ref 12.0–46.0)
MCHC: 33.5 g/dL (ref 30.0–36.0)
Monocytes Relative: 10.3 % (ref 3.0–12.0)
Neutrophils Relative %: 41.9 % — ABNORMAL LOW (ref 43.0–77.0)
RBC: 3.6 Mil/uL — ABNORMAL LOW (ref 4.22–5.81)
RDW: 13.8 % (ref 11.5–14.6)

## 2012-02-23 LAB — HEPATIC FUNCTION PANEL
Albumin: 4 g/dL (ref 3.5–5.2)
Alkaline Phosphatase: 53 U/L (ref 39–117)
Total Protein: 7.6 g/dL (ref 6.0–8.3)

## 2012-02-23 LAB — LIPID PANEL
Cholesterol: 118 mg/dL (ref 0–200)
HDL: 44.2 mg/dL (ref >39.00)
Triglycerides: 72 mg/dL (ref 0.0–149.0)
VLDL: 14.4 mg/dL (ref 0.0–40.0)

## 2012-02-23 LAB — TSH: TSH: 1.35 u[IU]/mL (ref 0.35–5.50)

## 2012-02-23 MED ORDER — MECLIZINE HCL 25 MG PO TABS: ORAL_TABLET | ORAL | Status: DC

## 2012-02-23 NOTE — Progress Notes (Addendum)
Subjective:    Patient ID: [name], male    DOB: [birth year], 78 y.o.   MRN: [id number]  HPI 78 y/o BM here for a follow up visit...  he has multiple medical problems as noted below...    ~  August 15, 2010:  he continues to do well- no new complaints or concerns... still smoking pipe regularly & not interested in smoking cessation help;  BP controlled on meds;  Chol has been good on Simva40;  his CC remains arthritis pains & Tramadol helps;  he continues to refuse colonoscopy & states he's doing well w/o pain, constip, blood, etc... OK refills for 2012.  ~  February 16, 2011:  30mo ROV & he remains stable, no new complaints or concerns;  Still smoking his pipe & CXR today shows scarring at the bases, otherw ok;  He denies CP, palpit, ch in SOB, edema> but he is too sedentary & weight 212# w/ BMI 34 (we reviewed diet & exercise);  Chol looks good on Simva40, other labs OK.  ~  August 24, 2011:  30mo ROV & [name] is doing remarkably well at age 24, no new complaints of concerns today...  We reviewed his med bottles today & gave him written refill prescriptions... He declines the season flu vaccines...    Smoker> still consumes one pouch pipe tobacco wkly & asked to cut way back/ quit...    HBP, RBBB> on ASA, MetoprololER50, Hyzaar50-12.5; BP= 122/70 & he feels well & denies CP, palpit, dizzy, SOB, edema, etc...    Chol> on Simva40 + diet rx; FLP check each Aug & all good, at goal, tol rx well, etc...    DJD, LBP> on Tramadol as needed; "I get around pretty good" he says...    Note> he has continued to refuse screening colonoscopy or DRE, but PSA has been wnl...  ~  February 23, 2012:  30mo ROV & [name] continues to do well, his only complaint is some intermittent mild dizziness & we discussed Rx w/ Meclizine prn...  BP controlled on Metop & Hyzaar;  FLP looks good on Simva40;  He has DJD but exercises regularly w/ walking & yard work, uses Tramadol as needed...     We reviewed prob list, meds, xrays  and labs> see below for updates >> CXR 8/13 showed normal heart size, clear lungs, kyphosis & DJD in spine w/ old compression fxs... LABS 8/13:  FLP- at goals on Simva40; Chems- look good w/ BS=106 A1c=6.2;  CBC- Hg=12.0;  TSH=1.35    PROBLEM LIST:   GLAUCOMA (ICD-365.9) - on gtts per [facility]...  PIPE SMOKER (ICD-305.1) - he continues to consume 1 pouch of cherry blend pipe tobacco weekly & still not interested in smoking cessation help...  HYPERTENSION (ICD-401.9) - controlled on TOPROL XL 50mg /d and HYZAAR 50-12.5/d...  ~  CXR 8/10 showed norm heart size, sl incr markings, NAD.Marland Kitchen. ~  CXR 8/12 showed basilar scarring, heart at upper limits norm size, DJD in TSpine & shoulders, NAD... ~  2/13:  BP=118/78 doing well- denies HA, fatigue, visual changes, CP, palipit, dizziness, syncope, dyspnea, edema, etc... ~  8/13:  BP= 118/72 & he remains largely asymptomatic...  RIGHT BUNDLE BRANCH BLOCK (ICD-426.4) - on ASA 81mg /d... old EKG's w/ RBBB, otherw normal... as noted- no CP, palpit, SOB, etc...  HYPERCHOLESTEROLEMIA (ICD-272.0) - on SIMVASTATIN 40mg /d + diet...  ~  FLP 8/08 showed TChol 124, TG 56, HDL 42, LDL 71... continue same Simva40. ~  FLP 8/09  showed TChol 112, TG 72, HDL 30, LDL 68 ~  FLP 8/10 showed TChol 137, TG 85, HDL 44, LDL 76... rec incr exerc, get wt down. ~  FLP 8/11 showed TChol 133, TG 61, HDL 43, LDL 77 ~  FLP 8/12 on Simva40 showed TChol 129, TG 79, HDL 46, LDL 67... Continue same. ~  FLP 8/13 on Simva40 showed TChol 118, TG 72, HDL 44, LDL 59  OVERWEIGHT (ICD-278.02) - discussed diet + exercise program... ~  weight 8/09 = 202# ~  weight 2/10 = 211#... he knows that he needs to do better. ~  weight 8/10 = 210# ~  weight 2/11 = 209# ~  weight 8/11 = 211#... we reviewed diet & exercise. ~  weight 2/12 = 215# ~  Weight 8/12 = 212#... 5'6"tall... BMI=34 & discussed w/ pt. ~  Weight 2/13 = 207# ~  Weight 8/13 = 206#  Hx of GERD (ICD-530.81) - no recent  symptoms... doing satis overall... we discussed Prilosec vs Zantac OTC Prn.  DEGENERATIVE JOINT DISEASE (ICD-715.90) - using Tramadol Prn (ave 1/d)... he notes some wrist pain- ACE wrap + Tramadol helps.  BACK PAIN, LUMBAR (ICD-724.2)  Hx of ABNORMALITY OF GAIT >> "I get around pretty good" & denies prob w/ ADLs, mows yard, walking & does housework...  Hx of ANGIOEDEMA (ICD-995.1) - ? etiology... no recurrence.  SURG/OTH PROC NOT CARRIED OUT BECAUSE PTS DECN (ICD-V64.2) - discussed again... he continues to refuse routine colonoscopy despite his wife's Dx w/ colon cancer 5/11...  Health Maintenance - he refuses the Flu vaccines but had TETANUS shot & PNEUMOVAX Feb2010... He also refuses DRE "as long as I'm doin all right, don't mess w/ it" and his PSA 8/12 = 0.46   Past Surgical History  Procedure Date  . Cataract surgery 2004/2005    Dr. Cecilie Kicks    Outpatient Encounter Prescriptions as of 02/23/2012  Medication Sig Dispense Refill  . aspirin 81 MG tablet Take 81 mg by mouth daily.        . brimonidine (ALPHAGAN) 0.2 % ophthalmic solution Place 1 drop into both eyes at bedtime.      . dorzolamide-timolol (COSOPT) 22.3-6.8 MG/ML ophthalmic solution Place 1 drop into both eyes 2 (two) times daily.       Marland Kitchen latanoprost (XALATAN) 0.005 % ophthalmic solution Place 1 drop into both eyes at bedtime.      Marland Kitchen losartan-hydrochlorothiazide (HYZAAR) 50-12.5 MG per tablet Take 1 tablet by mouth daily.  30 tablet  11  . metoprolol succinate (TOPROL-XL) 50 MG 24 hr tablet Take 1 tablet (50 mg total) by mouth daily.  30 tablet  11  . simvastatin (ZOCOR) 40 MG tablet Take 1 tablet (40 mg total) by mouth at bedtime.  30 tablet  11  . traMADol (ULTRAM) 50 MG tablet Take 1 tablet (50 mg total) by mouth every 6 (six) hours as needed.  90 tablet  5    Allergies  Allergen Reactions  . Iodine     REACTION: rash---whleps    Current Medications, Allergies, Past Medical History, Past Surgical History, Family  History, and Social History were reviewed in [employer] record.    Review of Systems    See HPI - all other systems neg except as noted...  The patient complains of dyspnea on exertion and difficulty walking.  The patient denies anorexia, fever, weight loss, weight gain, vision loss, decreased hearing, hoarseness, chest pain, syncope, peripheral edema, prolonged cough, headaches, hemoptysis, abdominal pain,  melena, hematochezia, severe indigestion/heartburn, hematuria, incontinence, muscle weakness, suspicious skin lesions, transient blindness, depression, unusual weight change, abnormal bleeding, enlarged lymph nodes, and any signs of recurrent angioedema.     Objective:   Physical Exam    WD, WN, 78 y/o BM in NAD... GENERAL:  Alert & oriented; pleasant & cooperative... HEENT:  [place]/AT, EOM-wnl, PERRLA, EACs-clear, TMs-wnl, NOSE-clear, THROAT-clear & wnl. NECK:  Supple w/ fairROM; no JVD; normal carotid impulses w/o bruits; no thyromegaly or nodules palpated; no lymphadenopathy. CHEST:  Clear to P & A; without wheezes/ rales/ or rhonchi heard...  HEART:  Regular Rhythm; without murmurs/ rubs/ or gallops detected... ABDOMEN:  Obese, soft & nontender; normal bowel sounds; no organomegaly or masses palpated... EXT: without deformities, mod arthritic changes; no varicose veins/ venous insuffic/ or edema. NEURO:  CN's intact; +gait abn;  no focal defects... DERM:  No lesions noted; no rash etc...  RADIOLOGY DATA:  Reviewed in the EPIC EMR & discussed w/ the patient...  LABORATORY DATA:  Reviewed in the EPIC EMR & discussed w/ the patient...   Assessment & Plan:   PIPE SMOKER/ Underlying COPD, Chr Bronchitis>  He is not about to quit his pipe; offered smoking cessation counseling & med help but he is not interested; f/u CXR w/ bibasilar scarring but NAD...  HBP>  Controlled on ToprolXL & Hyzaar, continue same...  RBBB>  He denies CP, palpit, ch in SOB, etc;  Needs  better diet/ exercise...  CHOL>  Stable on Simva40 + diet efforts...  Overweight>  Must get wt down & we reviewed diet & exercise...  DJD>  He has Tramadol for as needed use...  Health Maintenance> he has repeatedly refused DRE & colonoscopy...    Patient's Medications  New Prescriptions   MECLIZINE (ANTIVERT) 25 MG TABLET    Take 1/2 to 1 tablet by mouth every 4 hours as needed for dizziness  Previous Medications   ASPIRIN 81 MG TABLET    Take 81 mg by mouth daily.     BRIMONIDINE (ALPHAGAN) 0.2 % OPHTHALMIC SOLUTION    Place 1 drop into both eyes at bedtime.   DORZOLAMIDE-TIMOLOL (COSOPT) 22.3-6.8 MG/ML OPHTHALMIC SOLUTION    Place 1 drop into both eyes 2 (two) times daily.    LATANOPROST (XALATAN) 0.005 % OPHTHALMIC SOLUTION    Place 1 drop into both eyes at bedtime.   LOSARTAN-HYDROCHLOROTHIAZIDE (HYZAAR) 50-12.5 MG PER TABLET    Take 1 tablet by mouth daily.   METOPROLOL SUCCINATE (TOPROL-XL) 50 MG 24 HR TABLET    Take 1 tablet (50 mg total) by mouth daily.   SIMVASTATIN (ZOCOR) 40 MG TABLET    Take 1 tablet (40 mg total) by mouth at bedtime.   TRAMADOL (ULTRAM) 50 MG TABLET    Take 1 tablet (50 mg total) by mouth every 6 (six) hours as needed.  Modified Medications   No medications on file  Discontinued Medications   No medications on file

## 2012-02-23 NOTE — Patient Instructions (Addendum)
Today we updated your med list in our EPIC system...    Continue your current medications the same...  Today we did your follow up CXR & FASTING blood work...    We will call you w/ these reports when avail...  We wrote a new prescription for MECLIZINE to try one tab every 4-6H as needed for dizziness...  Call for any questions...  Let's plan a recheck in another 6 months.Marland KitchenMarland Kitchen

## 2012-04-15 DIAGNOSIS — H4011X0 Primary open-angle glaucoma, stage unspecified: Secondary | ICD-10-CM | POA: Diagnosis not present

## 2012-04-15 DIAGNOSIS — H409 Unspecified glaucoma: Secondary | ICD-10-CM | POA: Diagnosis not present

## 2012-06-17 DIAGNOSIS — H409 Unspecified glaucoma: Secondary | ICD-10-CM | POA: Diagnosis not present

## 2012-06-17 DIAGNOSIS — H4011X0 Primary open-angle glaucoma, stage unspecified: Secondary | ICD-10-CM | POA: Diagnosis not present

## 2012-07-31 DIAGNOSIS — H409 Unspecified glaucoma: Secondary | ICD-10-CM | POA: Diagnosis not present

## 2012-07-31 DIAGNOSIS — H4011X0 Primary open-angle glaucoma, stage unspecified: Secondary | ICD-10-CM | POA: Diagnosis not present

## 2012-08-23 ENCOUNTER — Other Ambulatory Visit: Payer: Self-pay | Admitting: Pulmonary Disease

## 2012-08-26 ENCOUNTER — Telehealth: Payer: Self-pay | Admitting: Pulmonary Disease

## 2012-08-26 NOTE — Telephone Encounter (Signed)
Spoke with pt.  Pt is requesting rx for simvastatin 40 mg to be sent to [facility].  This was sent earlier by [facility] #[phone] x [phone].  I called [facility], spoke with Ethel.  Was advised they did receive rx, and it is ready for pick up.  Pt aware.

## 2012-08-26 NOTE — Telephone Encounter (Signed)
LMTCB

## 2012-08-30 ENCOUNTER — Encounter: Payer: Self-pay | Admitting: Pulmonary Disease

## 2012-08-30 ENCOUNTER — Ambulatory Visit (INDEPENDENT_AMBULATORY_CARE_PROVIDER_SITE_OTHER): Payer: Medicare Other | Admitting: Pulmonary Disease

## 2012-08-30 VITALS — BP 118/72 | HR 66 | Temp 97.6°F | Ht 66.0 in | Wt 219.0 lb

## 2012-08-30 DIAGNOSIS — E7800 Pure hypercholesterolemia, unspecified: Secondary | ICD-10-CM

## 2012-08-30 DIAGNOSIS — I4510 Unspecified right bundle-branch block: Secondary | ICD-10-CM | POA: Diagnosis not present

## 2012-08-30 DIAGNOSIS — E663 Overweight: Secondary | ICD-10-CM | POA: Diagnosis not present

## 2012-08-30 MED ORDER — METOPROLOL SUCCINATE ER 50 MG PO TB24: 50.0000 mg | ORAL_TABLET | Freq: Every day | ORAL | Status: DC

## 2012-08-30 MED ORDER — LOSARTAN POTASSIUM-HCTZ 50-12.5 MG PO TABS: 1.0000 | ORAL_TABLET | Freq: Every day | ORAL | Status: DC

## 2012-08-30 NOTE — Patient Instructions (Addendum)
Today we updated your med list in our EPIC system...    Continue your current medications the same...    We refilled your meds per request...  Today we did your follow up EKG & it is stable...  Let's get on track w/ our diet & exercise program...    The goal is to lose 15-20 lbs!  Call for any questions...  Let's plan a recheck in 6 months w/ FASTING blood work at that time.Marland KitchenMarland Kitchen

## 2012-08-30 NOTE — Progress Notes (Signed)
Subjective:    Patient ID: [name], male    DOB: 1925/07/16, 79 y.o.   MRN: [id number]  HPI 79 y/o BM here for a follow up visit...  he has multiple medical problems as noted below...    ~  August 24, 2011:  2mo ROV & [name] is doing remarkably well at age 82, no new complaints of concerns today...  We reviewed his med bottles today & gave him written refill prescriptions... He declines the season flu vaccines...    Smoker> still consumes one pouch pipe tobacco wkly & asked to cut way back/ quit...    HBP, RBBB> on ASA, MetoprololER50, Hyzaar50-12.5; BP= 122/70 & he feels well & denies CP, palpit, dizzy, SOB, edema, etc...    Chol> on Simva40 + diet rx; FLP check each Aug & all good, at goal, tol rx well, etc...    DJD, LBP> on Tramadol as needed; "I get around pretty good" he says...    Note> he has continued to refuse screening colonoscopy or DRE, but PSA has been wnl...  ~  February 23, 2012:  2mo ROV & [name] continues to do well, his only complaint is some intermittent mild dizziness & we discussed Rx w/ Meclizine prn...  BP controlled on Metop & Hyzaar;  FLP looks good on Simva40;  He has DJD but exercises regularly w/ walking & yard work, uses Tramadol as needed...     We reviewed prob list, meds, xrays and labs> see below for updates >> CXR 8/13 showed normal heart size, clear lungs, kyphosis & DJD in spine w/ old compression fxs... LABS 8/13:  FLP- at goals on Simva40; Chems- look good w/ BS=106 A1c=6.2;  CBC- Hg=12.0;  TSH=1.35   ~  August 30, 2012:  2mo ROV & [name] has gained 13# up to 219# today- we discussed diet & exercise program needed; otherw feeling well w/o new complaints or concerns... We reviewed the following medical problems during today's office visit >>     Smoker> still consumes one pouch pipe tobacco wkly & again asked to cut way back/ quit; he has min cough/ phlegm, denies hemoptysis/ SOB/ CP/ etc...    HBP, RBBB> on ASA, MetoprololER50, Hyzaar50-12.5; BP=  118/72 & he feels well & denies CP, palpit, dizzy, SOB, edema, etc...    Chol> on Simva40 + diet rx; last FLP was 8/13 & showed TChol 118, TG 72, HDL 44, LDL 59    DJD, LBP> on Tramadol as needed; "I get around pretty good" he says...    Note> he has continued to refuse screening colonoscopy or DRE, but PSA has been wnl... We reviewed prob list, meds, xrays and labs> see below for updates >> he has repeatedly refused the Flu vaccine...         PROBLEM LIST:   GLAUCOMA (ICD-365.9) - on gtts per [facility]...  PIPE SMOKER (ICD-305.1) - he continues to consume 1 pouch of cherry blend pipe tobacco weekly & still not interested in smoking cessation help...  HYPERTENSION (ICD-401.9) - controlled on TOPROL XL 50mg /d and HYZAAR 50-12.5/d...  ~  CXR 8/10 showed norm heart size, sl incr markings, NAD.Marland Kitchen. ~  CXR 8/12 showed basilar scarring, heart at upper limits norm size, DJD in TSpine & shoulders, NAD... ~  2/13:  BP=118/78 doing well- denies HA, fatigue, visual changes, CP, palipit, dizziness, syncope, dyspnea, edema, etc... ~  8/13:  BP= 118/72 & he remains largely asymptomatic... ~  2/14: on ASA, MetoprololER50, Hyzaar50-12.5; BP= 118/72 &  he feels well & denies CP, palpit, dizzy, SOB, edema, etc  RIGHT BUNDLE BRANCH BLOCK (ICD-426.4) - on ASA 81mg /d... old EKG's w/ RBBB, otherw normal... as noted- no CP, palpit, SOB, etc...  HYPERCHOLESTEROLEMIA (ICD-272.0) - on SIMVASTATIN 40mg /d + diet...  ~  FLP 8/08 showed TChol 124, TG 56, HDL 42, LDL 71... continue same Simva40. ~  FLP 8/09 showed TChol 112, TG 72, HDL 30, LDL 68 ~  FLP 8/10 showed TChol 137, TG 85, HDL 44, LDL 76... rec incr exerc, get wt down. ~  FLP 8/11 showed TChol 133, TG 61, HDL 43, LDL 77 ~  FLP 8/12 on Simva40 showed TChol 129, TG 79, HDL 46, LDL 67... Continue same. ~  FLP 8/13 on Simva40 showed TChol 118, TG 72, HDL 44, LDL 59  OVERWEIGHT (ICD-278.02) - discussed diet + exercise program... ~  weight 8/09 = 202# ~   weight 2/10 = 211#... he knows that he needs to do better. ~  weight 8/10 = 210# ~  weight 2/11 = 209# ~  weight 8/11 = 211#... we reviewed diet & exercise. ~  weight 2/12 = 215# ~  Weight 8/12 = 212#... 5'6"tall... BMI=34 & discussed w/ pt. ~  Weight 2/13 = 207# ~  Weight 8/13 = 206# ~  Weight 2/14 = 219#... We reviewed diet, exercise, wt reduction strategies...  Hx of GERD (ICD-530.81) - no recent symptoms... doing satis overall... we discussed Prilosec vs Zantac OTC Prn.  DEGENERATIVE JOINT DISEASE (ICD-715.90) - using Tramadol Prn (ave 1/d)... he notes some wrist pain- ACE wrap + Tramadol helps.  BACK PAIN, LUMBAR (ICD-724.2)  Hx of ABNORMALITY OF GAIT >> "I get around pretty good" & denies prob w/ ADLs, mows yard, walking & does housework...  Hx of ANGIOEDEMA (ICD-995.1) - ? etiology... no recurrence.  SURG/OTH PROC NOT CARRIED OUT BECAUSE PTS DECN (ICD-V64.2) - discussed again... he continues to refuse routine colonoscopy despite his wife's Dx w/ colon cancer 5/11...  Health Maintenance - he refuses the Flu vaccines but had TETANUS shot & PNEUMOVAX Feb2010... He also refuses DRE "as long as I'm doin all right, don't mess w/ it" and his PSA 8/12 = 0.46   Past Surgical History  Procedure Laterality Date  . Cataract surgery  2004/2005    Dr. Cecilie Kicks    Outpatient Encounter Prescriptions as of 08/30/2012  Medication Sig Dispense Refill  . aspirin 81 MG tablet Take 81 mg by mouth daily.        . dorzolamide-timolol (COSOPT) 22.3-6.8 MG/ML ophthalmic solution Place 1 drop into both eyes 2 (two) times daily.       Marland Kitchen latanoprost (XALATAN) 0.005 % ophthalmic solution Place 1 drop into both eyes at bedtime.      Marland Kitchen losartan-hydrochlorothiazide (HYZAAR) 50-12.5 MG per tablet Take 1 tablet by mouth daily.  30 tablet  11  . meclizine (ANTIVERT) 25 MG tablet Take 1/2 to 1 tablet by mouth every 4 hours as needed for dizziness  50 tablet  6  . metoprolol succinate (TOPROL-XL) 50 MG 24 hr  tablet Take 1 tablet (50 mg total) by mouth daily.  30 tablet  11  . pilocarpine (PILOCAR) 1 % ophthalmic solution Place 1 drop into the right eye 3 (three) times daily.      . simvastatin (ZOCOR) 40 MG tablet take 1 tablet by mouth at bedtime  30 tablet  11  . traMADol (ULTRAM) 50 MG tablet Take 1 tablet (50 mg total) by mouth every 6 (six)  hours as needed.  90 tablet  5  . [DISCONTINUED] brimonidine (ALPHAGAN) 0.2 % ophthalmic solution Place 1 drop into both eyes at bedtime.       No facility-administered encounter medications on file as of 08/30/2012.    Allergies  Allergen Reactions  . Iodine     REACTION: rash---whleps    Current Medications, Allergies, Past Medical History, Past Surgical History, Family History, and Social History were reviewed in [facility] record.    Review of Systems    See HPI - all other systems neg except as noted...  The patient complains of dyspnea on exertion and difficulty walking.  The patient denies anorexia, fever, weight loss, weight gain, vision loss, decreased hearing, hoarseness, chest pain, syncope, peripheral edema, prolonged cough, headaches, hemoptysis, abdominal pain, melena, hematochezia, severe indigestion/heartburn, hematuria, incontinence, muscle weakness, suspicious skin lesions, transient blindness, depression, unusual weight change, abnormal bleeding, enlarged lymph nodes, and any signs of recurrent angioedema.     Objective:   Physical Exam    WD, WN, 79 y/o BM in NAD... GENERAL:  Alert & oriented; pleasant & cooperative... HEENT:  [place]/AT, EOM-wnl, PERRLA, EACs-clear, TMs-wnl, NOSE-clear, THROAT-clear & wnl. NECK:  Supple w/ fairROM; no JVD; normal carotid impulses w/o bruits; no thyromegaly or nodules palpated; no lymphadenopathy. CHEST:  Clear to P & A; without wheezes/ rales/ or rhonchi heard...  HEART:  Regular Rhythm; without murmurs/ rubs/ or gallops detected... ABDOMEN:  Obese, soft & nontender; normal  bowel sounds; no organomegaly or masses palpated... EXT: without deformities, mod arthritic changes; no varicose veins/ venous insuffic/ or edema. NEURO:  CN's intact; +gait abn;  no focal defects... DERM:  No lesions noted; no rash etc...  RADIOLOGY DATA:  Reviewed in the EPIC EMR & discussed w/ the patient...  LABORATORY DATA:  Reviewed in the EPIC EMR & discussed w/ the patient...   Assessment & Plan:    PIPE SMOKER/ Underlying COPD, Chr Bronchitis>  He is not about to quit his pipe; offered smoking cessation counseling & med help but he is not interested; f/u CXR w/ bibasilar scarring but NAD...  HBP>  Controlled on ToprolXL & Hyzaar, continue same...  RBBB>  He denies CP, palpit, ch in SOB, etc;  Needs better diet/ exercise...  CHOL>  Stable on Simva40 + diet efforts...  Overweight>  Must get wt down & we reviewed diet & exercise...  DJD>  He has Tramadol for as needed use...  Health Maintenance> he has repeatedly refused DRE & colonoscopy...    Patient's Medications  New Prescriptions   No medications on file  Previous Medications   ASPIRIN 81 MG TABLET    Take 81 mg by mouth daily.     DORZOLAMIDE-TIMOLOL (COSOPT) 22.3-6.8 MG/ML OPHTHALMIC SOLUTION    Place 1 drop into both eyes 2 (two) times daily.    LATANOPROST (XALATAN) 0.005 % OPHTHALMIC SOLUTION    Place 1 drop into both eyes at bedtime.   MECLIZINE (ANTIVERT) 25 MG TABLET    Take 1/2 to 1 tablet by mouth every 4 hours as needed for dizziness   PILOCARPINE (PILOCAR) 1 % OPHTHALMIC SOLUTION    Place 1 drop into the right eye 3 (three) times daily.   SIMVASTATIN (ZOCOR) 40 MG TABLET    take 1 tablet by mouth at bedtime   TRAMADOL (ULTRAM) 50 MG TABLET    Take 1 tablet (50 mg total) by mouth every 6 (six) hours as needed.  Modified Medications   Modified Medication Previous  Medication   LOSARTAN-HYDROCHLOROTHIAZIDE (HYZAAR) 50-12.5 MG PER TABLET losartan-hydrochlorothiazide (HYZAAR) 50-12.5 MG per tablet      Take  1 tablet by mouth daily.    Take 1 tablet by mouth daily.   METOPROLOL SUCCINATE (TOPROL-XL) 50 MG 24 HR TABLET metoprolol succinate (TOPROL-XL) 50 MG 24 hr tablet      Take 1 tablet (50 mg total) by mouth daily.    Take 1 tablet (50 mg total) by mouth daily.  Discontinued Medications   BRIMONIDINE (ALPHAGAN) 0.2 % OPHTHALMIC SOLUTION    Place 1 drop into both eyes at bedtime.

## 2012-09-12 ENCOUNTER — Telehealth: Payer: Self-pay | Admitting: Pulmonary Disease

## 2012-09-12 ENCOUNTER — Other Ambulatory Visit: Payer: Self-pay | Admitting: Pulmonary Disease

## 2012-09-12 MED ORDER — TRAMADOL HCL 50 MG PO TABS
50.0000 mg | ORAL_TABLET | Freq: Four times a day (QID) | ORAL | Status: DC | PRN
Start: 2012-09-12 — End: 2013-06-04

## 2012-09-12 NOTE — Telephone Encounter (Signed)
Pt aware that we have sent in the prescription for him. I apologized for the confusion and inconvenience.

## 2012-10-18 DIAGNOSIS — H4011X0 Primary open-angle glaucoma, stage unspecified: Secondary | ICD-10-CM | POA: Diagnosis not present

## 2012-10-30 DIAGNOSIS — H409 Unspecified glaucoma: Secondary | ICD-10-CM | POA: Diagnosis not present

## 2012-10-30 DIAGNOSIS — H4011X0 Primary open-angle glaucoma, stage unspecified: Secondary | ICD-10-CM | POA: Diagnosis not present

## 2013-02-27 ENCOUNTER — Ambulatory Visit (INDEPENDENT_AMBULATORY_CARE_PROVIDER_SITE_OTHER)
Admission: RE | Admit: 2013-02-27 | Discharge: 2013-02-27 | Disposition: A | Discharge status: Home / Self Care | Payer: Medicare Other | Source: Ambulatory Visit | Attending: Pulmonary Disease | Admitting: Pulmonary Disease

## 2013-02-27 ENCOUNTER — Encounter: Payer: Self-pay | Admitting: Pulmonary Disease

## 2013-02-27 ENCOUNTER — Other Ambulatory Visit (INDEPENDENT_AMBULATORY_CARE_PROVIDER_SITE_OTHER): Payer: Medicare Other

## 2013-02-27 ENCOUNTER — Ambulatory Visit (INDEPENDENT_AMBULATORY_CARE_PROVIDER_SITE_OTHER): Payer: Medicare Other | Admitting: Pulmonary Disease

## 2013-02-27 VITALS — BP 134/68 | HR 60 | Temp 97.8°F | Ht 66.0 in | Wt 220.4 lb

## 2013-02-27 DIAGNOSIS — I10 Essential (primary) hypertension: Secondary | ICD-10-CM | POA: Diagnosis not present

## 2013-02-27 DIAGNOSIS — E559 Vitamin D deficiency, unspecified: Secondary | ICD-10-CM

## 2013-02-27 DIAGNOSIS — I4510 Unspecified right bundle-branch block: Secondary | ICD-10-CM | POA: Diagnosis not present

## 2013-02-27 DIAGNOSIS — F17200 Nicotine dependence, unspecified, uncomplicated: Secondary | ICD-10-CM | POA: Diagnosis not present

## 2013-02-27 DIAGNOSIS — E7800 Pure hypercholesterolemia, unspecified: Secondary | ICD-10-CM

## 2013-02-27 DIAGNOSIS — M545 Low back pain, unspecified: Secondary | ICD-10-CM

## 2013-02-27 DIAGNOSIS — E663 Overweight: Secondary | ICD-10-CM

## 2013-02-27 DIAGNOSIS — J984 Other disorders of lung: Secondary | ICD-10-CM | POA: Diagnosis not present

## 2013-02-27 DIAGNOSIS — F411 Generalized anxiety disorder: Secondary | ICD-10-CM

## 2013-02-27 DIAGNOSIS — F419 Anxiety disorder, unspecified: Secondary | ICD-10-CM

## 2013-02-27 DIAGNOSIS — M1990 Unspecified osteoarthritis, unspecified site: Secondary | ICD-10-CM

## 2013-02-27 LAB — CBC WITH DIFFERENTIAL/PLATELET
Basophils Relative: 0.3 % (ref 0.0–3.0)
Eosinophils Absolute: 0.4 10*3/uL (ref 0.0–0.7)
Eosinophils Relative: 6.8 % — ABNORMAL HIGH (ref 0.0–5.0)
Hemoglobin: 12.6 g/dL — ABNORMAL LOW (ref 13.0–17.0)
Lymphocytes Relative: 27 % (ref 12.0–46.0)
MCHC: 34.4 g/dL (ref 30.0–36.0)
MCV: 98 fl (ref 78.0–100.0)
Monocytes Absolute: 0.6 10*3/uL (ref 0.1–1.0)
Neutro Abs: 3.4 10*3/uL (ref 1.4–7.7)
Neutrophils Relative %: 55.3 % (ref 43.0–77.0)
RBC: 3.75 Mil/uL — ABNORMAL LOW (ref 4.22–5.81)
WBC: 6.1 10*3/uL (ref 4.5–10.5)

## 2013-02-27 LAB — LIPID PANEL
Cholesterol: 141 mg/dL (ref 0–200)
HDL: 43.9 mg/dL (ref >39.00)
LDL Cholesterol: 79 mg/dL (ref 0–99)
Total CHOL/HDL Ratio: 3
Triglycerides: 89 mg/dL (ref 0.0–149.0)
VLDL: 17.8 mg/dL (ref 0.0–40.0)

## 2013-02-27 LAB — HEPATIC FUNCTION PANEL
Bilirubin, Direct: 0.1 mg/dL (ref 0.0–0.3)
Total Bilirubin: 0.7 mg/dL (ref 0.3–1.2)

## 2013-02-27 LAB — BASIC METABOLIC PANEL
BUN: 20 mg/dL (ref 6–23)
Calcium: 9.5 mg/dL (ref 8.4–10.5)
Creatinine, Ser: 1.4 mg/dL (ref 0.4–1.5)

## 2013-02-27 MED ORDER — CLOTRIMAZOLE-BETAMETHASONE 1-0.05 % EX CREA: TOPICAL_CREAM | CUTANEOUS | Status: DC

## 2013-02-27 NOTE — Patient Instructions (Addendum)
Today we updated your med list in our EPIC system...    Continue your current medications the same...  Today we did your follow up CXR & FASTING blood work...    We will contact you w/ the results when available...   Let's get on track w/ our diet & exercise progam...    The goal is to lose 15-20 lbs!!!  Remember to elim the salt/ sodium from your diet...  We added a cream to use as needed on your rash...  Call for any questions...  Let's plan a follow up visit in 66mo, sooner if needed for problems.Marland KitchenMarland Kitchen

## 2013-02-27 NOTE — Progress Notes (Signed)
Subjective:    Patient ID: [name], male    DOB: Jun 20, 1926, 79 y.o.   MRN: [id number]  HPI 79 y/o BM here for a follow up visit...  he has multiple medical problems as noted below...    ~  August 24, 2011:  19mo ROV & [name] is doing remarkably well at age 12, no new complaints of concerns today...  We reviewed his med bottles today & gave him written refill prescriptions... He declines the season flu vaccines...    Smoker> still consumes one pouch pipe tobacco wkly & asked to cut way back/ quit...    HBP, RBBB> on ASA, MetoprololER50, Hyzaar50-12.5; BP= 122/70 & he feels well & denies CP, palpit, dizzy, SOB, edema, etc...    Chol> on Simva40 + diet rx; FLP check each Aug & all good, at goal, tol rx well, etc...    DJD, LBP> on Tramadol as needed; "I get around pretty good" he says...    Note> he has continued to refuse screening colonoscopy or DRE, but PSA has been wnl...  ~  February 23, 2012:  19mo ROV & [name] continues to do well, his only complaint is some intermittent mild dizziness & we discussed Rx w/ Meclizine prn...  BP controlled on Metop & Hyzaar;  FLP looks good on Simva40;  He has DJD but exercises regularly w/ walking & yard work, uses Tramadol as needed...     We reviewed prob list, meds, xrays and labs> see below for updates >> CXR 8/13 showed normal heart size, clear lungs, kyphosis & DJD in spine w/ old compression fxs... LABS 8/13:  FLP- at goals on Simva40; Chems- look good w/ BS=106 A1c=6.2;  CBC- Hg=12.0;  TSH=1.35   ~  August 30, 2012:  19mo ROV & [place] has gained 13# up to 219# today- we discussed diet & exercise program needed; otherw feeling well w/o new complaints or concerns... We reviewed the following medical problems during today's office visit >>     Smoker> still consumes one pouch pipe tobacco wkly & again asked to cut way back/ quit; he has min cough/ phlegm, denies hemoptysis/ SOB/ CP/ etc...    HBP, RBBB> on ASA, MetoprololER50, Hyzaar50-12.5; BP=  118/72 & he feels well & denies CP, palpit, dizzy, SOB, edema, etc...    Chol> on Simva40 + diet rx; last FLP was 8/13 & showed TChol 118, TG 72, HDL 44, LDL 59    DJD, LBP> on Tramadol as needed; "I get around pretty good" he says...    Note> he has continued to refuse screening colonoscopy or DRE, but PSA has been wnl... We reviewed prob list, meds, xrays and labs> see below for updates >> he has repeatedly refused the Flu vaccine...  ~  February 27, 2013:  19mo ROV & [name]'s CC is arthritis pain & mild rash w/ itching; he has Tramadol as needed for the former & we wrote for Lotrisone cream to treat the latter... We reviewed the following medical problems during today's office visit >>     Smoker> still consumes one pouch pipe tobacco wkly & again asked to cut way back/ quit; he has min cough/ phlegm, denies hemoptysis/ SOB/ CP/ etc; f/u CXR 8/14 w/o acute changes...    HBP, RBBB> on ASA, MetoprololER50, Hyzaar50-12.5; BP= 134/68 & he feels well & denies CP, palpit, dizzy, SOB, edema, etc...    Chol> on Simva40 + diet rx; FLP 8/14 showed TChol 141, TG 89, HDL 44, LDL 79  DJD, LBP> on Tramadol as needed; "I get around pretty good" he says...    Note> he has repeatedly refused DRE & colonoscopy (now too old); he denies prob w/ stool or urine & PSA has been wnl...... We reviewed prob list, meds, xrays and labs> see below for updates >>  CXR 8/14 showed borderline heart size, calcif ectatic Ao, clear lungs, degen spondylosis w/ osteophytes... LABS 8/14:  FLP- at goals on Simva40;  Chems- ok w/ BS=121 Cr=1.4;  CBC- ok w/ Hg=12.6;  TSH=1.64;  VitD=19 & rec mensMVI + VitD2000/d...         PROBLEM LIST:   GLAUCOMA (ICD-365.9) - on gtts per [facility]...  PIPE SMOKER (ICD-305.1) - he continues to consume 1 pouch of cherry blend pipe tobacco weekly & still not interested in smoking cessation help... ~  CXR 8/14 showed borderline heart size, calcif ectatic Ao, clear lungs, degen spondylosis w/  osteophytes  HYPERTENSION (ICD-401.9) - controlled on TOPROL XL 50mg /d and HYZAAR 50-12.5/d...  ~  CXR 8/10 showed norm heart size, sl incr markings, NAD.Marland Kitchen. ~  CXR 8/12 showed basilar scarring, heart at upper limits norm size, DJD in TSpine & shoulders, NAD... ~  2/13:  BP=118/78 doing well- denies HA, fatigue, visual changes, CP, palipit, dizziness, syncope, dyspnea, edema, etc... ~  8/13:  BP= 118/72 & he remains largely asymptomatic... ~  2/14: on ASA, MetoprololER50, Hyzaar50-12.5; BP= 118/72 & he feels well & denies CP, palpit, dizzy, SOB, edema, etc ~  8/14: on ASA, MetoprololER50, Hyzaar50-12.5; BP= 134/68 & he feels well & denies CP, palpit, dizzy, SOB, edema, etc.  RIGHT BUNDLE BRANCH BLOCK (ICD-426.4) - on ASA 81mg /d... old EKG's w/ RBBB, otherw normal... as noted- no CP, palpit, SOB, etc...  HYPERCHOLESTEROLEMIA (ICD-272.0) - on SIMVASTATIN 40mg /d + diet...  ~  FLP 8/08 showed TChol 124, TG 56, HDL 42, LDL 71... continue same Simva40. ~  FLP 8/09 showed TChol 112, TG 72, HDL 30, LDL 68 ~  FLP 8/10 showed TChol 137, TG 85, HDL 44, LDL 76... rec incr exerc, get wt down. ~  FLP 8/11 showed TChol 133, TG 61, HDL 43, LDL 77 ~  FLP 8/12 on Simva40 showed TChol 129, TG 79, HDL 46, LDL 67... Continue same. ~  FLP 8/13 on Simva40 showed TChol 118, TG 72, HDL 44, LDL 59 ~  FLP 8/14 on Simva40 showed TChol 141, TG 89, HDL 44, LDL 79  OVERWEIGHT (ICD-278.02) - discussed diet + exercise program... ~  weight 8/09 = 202# ~  weight 2/10 = 211#... he knows that he needs to do better. ~  weight 8/10 = 210# ~  weight 2/11 = 209# ~  weight 8/11 = 211#... we reviewed diet & exercise. ~  weight 2/12 = 215# ~  Weight 8/12 = 212#... 5'6"tall... BMI=34 & discussed w/ pt. ~  Weight 2/13 = 207# ~  Weight 8/13 = 206# ~  Weight 2/14 = 219#... We reviewed diet, exercise, wt reduction strategies... ~  Weight 8/14 = 220#  Hx of GERD (ICD-530.81) - no recent symptoms... doing satis overall... we  discussed Prilosec vs Zantac OTC Prn.  DEGENERATIVE JOINT DISEASE (ICD-715.90) - using Tramadol Prn (ave 1/d)... he notes some wrist pain- ACE wrap + Tramadol helps.  BACK PAIN, LUMBAR (ICD-724.2)  Hx of ABNORMALITY OF GAIT >> "I get around pretty good" & denies prob w/ ADLs, mows yard, walking & does housework...  VITAMIN D DEFICIENCY >> ~  Labs 8/14 showed Vit D level =  19 & rec to take a men's formula MVI + OTC Vit D 2000u daily...  Hx of ANGIOEDEMA (ICD-995.1) - ? etiology... no recurrence.  SURG/OTH PROC NOT CARRIED OUT BECAUSE PTS DECN (ICD-V64.2) - discussed again... he continues to refuse routine colonoscopy despite his wife's Dx w/ colon cancer 5/11...  Health Maintenance - he refuses the Flu vaccines but had TETANUS shot & PNEUMOVAX Feb2010... He also refuses DRE "as long as I'm doin all right, don't mess w/ it" and his PSA 8/12 = 0.46   Past Surgical History  Procedure Laterality Date  . Cataract surgery  2004/2005    Dr. Cecilie Kicks    Outpatient Encounter Prescriptions as of 02/27/2013  Medication Sig Dispense Refill  . aspirin 81 MG tablet Take 81 mg by mouth daily.        . dorzolamide-timolol (COSOPT) 22.3-6.8 MG/ML ophthalmic solution Place 1 drop into both eyes 2 (two) times daily.       Marland Kitchen latanoprost (XALATAN) 0.005 % ophthalmic solution Place 1 drop into both eyes at bedtime.      Marland Kitchen losartan-hydrochlorothiazide (HYZAAR) 50-12.5 MG per tablet Take 1 tablet by mouth daily.  30 tablet  11  . meclizine (ANTIVERT) 25 MG tablet Take 1/2 to 1 tablet by mouth every 4 hours as needed for dizziness  50 tablet  6  . metoprolol succinate (TOPROL-XL) 50 MG 24 hr tablet Take 1 tablet (50 mg total) by mouth daily.  30 tablet  11  . pilocarpine (PILOCAR) 1 % ophthalmic solution Place 1 drop into the right eye 3 (three) times daily.      . simvastatin (ZOCOR) 40 MG tablet take 1 tablet by mouth at bedtime  30 tablet  11  . traMADol (ULTRAM) 50 MG tablet Take 1 tablet (50 mg total) by  mouth every 6 (six) hours as needed.  90 tablet  5   No facility-administered encounter medications on file as of 02/27/2013.    Allergies  Allergen Reactions  . Iodine     REACTION: rash---whleps    Current Medications, Allergies, Past Medical History, Past Surgical History, Family History, and Social History were reviewed in [employer] record.    Review of Systems    See HPI - all other systems neg except as noted...  The patient complains of dyspnea on exertion and difficulty walking.  The patient denies anorexia, fever, weight loss, weight gain, vision loss, decreased hearing, hoarseness, chest pain, syncope, peripheral edema, prolonged cough, headaches, hemoptysis, abdominal pain, melena, hematochezia, severe indigestion/heartburn, hematuria, incontinence, muscle weakness, suspicious skin lesions, transient blindness, depression, unusual weight change, abnormal bleeding, enlarged lymph nodes, and any signs of recurrent angioedema.     Objective:   Physical Exam    WD, WN, 79 y/o BM in NAD... GENERAL:  Alert & oriented; pleasant & cooperative... HEENT:  [place]/AT, EOM-wnl, PERRLA, EACs-clear, TMs-wnl, NOSE-clear, THROAT-clear & wnl. NECK:  Supple w/ fairROM; no JVD; normal carotid impulses w/o bruits; no thyromegaly or nodules palpated; no lymphadenopathy. CHEST:  Clear to P & A; without wheezes/ rales/ or rhonchi heard...  HEART:  Regular Rhythm; without murmurs/ rubs/ or gallops detected... ABDOMEN:  Obese, soft & nontender; normal bowel sounds; no organomegaly or masses palpated... EXT: without deformities, mod arthritic changes; no varicose veins/ venous insuffic/ or edema. NEURO:  CN's intact; +gait abn;  no focal defects... DERM:  No lesions noted; no rash etc...  RADIOLOGY DATA:  Reviewed in the EPIC EMR & discussed w/ the patient...  LABORATORY  DATA:  Reviewed in the EPIC EMR & discussed w/ the patient...   Assessment & Plan:    PIPE SMOKER/  Underlying COPD, Chr Bronchitis>  He is not about to quit his pipe; offered smoking cessation counseling & med help but he is not interested; f/u CXR 8/14 w/ bibasilar scarring but NAD...  HBP>  Controlled on ToprolXL & Hyzaar, continue same...  RBBB>  He denies CP, palpit, ch in SOB, etc;  Needs better diet/ exercise...  CHOL>  Stable on Simva40 + diet efforts...  Overweight>  Must get wt down & we reviewed diet & exercise...  DJD>  He has Tramadol for as needed use...  Health Maintenance> he has repeatedly refused DRE & colonoscopy...    Patient's Medications  New Prescriptions   CLOTRIMAZOLE-BETAMETHASONE (LOTRISONE) CREAM    Apply to rash as needed  Previous Medications   ASPIRIN 81 MG TABLET    Take 81 mg by mouth daily.     CHOLECALCIFEROL (VITAMIN D) 2000 UNITS CAPS    Take 1 capsule by mouth daily.   DORZOLAMIDE-TIMOLOL (COSOPT) 22.3-6.8 MG/ML OPHTHALMIC SOLUTION    Place 1 drop into both eyes 2 (two) times daily.    LATANOPROST (XALATAN) 0.005 % OPHTHALMIC SOLUTION    Place 1 drop into both eyes at bedtime.   LOSARTAN-HYDROCHLOROTHIAZIDE (HYZAAR) 50-12.5 MG PER TABLET    Take 1 tablet by mouth daily.   MECLIZINE (ANTIVERT) 25 MG TABLET    Take 1/2 to 1 tablet by mouth every 4 hours as needed for dizziness   METOPROLOL SUCCINATE (TOPROL-XL) 50 MG 24 HR TABLET    Take 1 tablet (50 mg total) by mouth daily.   MULTIPLE VITAMINS-MINERALS (MENS MULTIVITAMIN PLUS PO)    Take 1 tablet by mouth daily.   PILOCARPINE (PILOCAR) 1 % OPHTHALMIC SOLUTION    Place 1 drop into the right eye 3 (three) times daily.   SIMVASTATIN (ZOCOR) 40 MG TABLET    take 1 tablet by mouth at bedtime   TRAMADOL (ULTRAM) 50 MG TABLET    Take 1 tablet (50 mg total) by mouth every 6 (six) hours as needed.  Modified Medications   No medications on file  Discontinued Medications   No medications on file

## 2013-02-28 LAB — VITAMIN D 25 HYDROXY (VIT D DEFICIENCY, FRACTURES): Vit D, 25-Hydroxy: 19 ng/mL — ABNORMAL LOW (ref 30–89)

## 2013-03-01 ENCOUNTER — Encounter: Payer: Self-pay | Admitting: Pulmonary Disease

## 2013-05-19 DIAGNOSIS — H4011X0 Primary open-angle glaucoma, stage unspecified: Secondary | ICD-10-CM | POA: Diagnosis not present

## 2013-05-19 DIAGNOSIS — H409 Unspecified glaucoma: Secondary | ICD-10-CM | POA: Diagnosis not present

## 2013-06-04 ENCOUNTER — Other Ambulatory Visit: Payer: Self-pay | Admitting: Pulmonary Disease

## 2013-08-18 ENCOUNTER — Other Ambulatory Visit: Payer: Self-pay | Admitting: Pulmonary Disease

## 2013-09-03 ENCOUNTER — Ambulatory Visit (INDEPENDENT_AMBULATORY_CARE_PROVIDER_SITE_OTHER): Payer: Medicare Other | Admitting: Pulmonary Disease

## 2013-09-03 ENCOUNTER — Encounter: Payer: Self-pay | Admitting: Pulmonary Disease

## 2013-09-03 VITALS — BP 120/84 | HR 81 | Temp 97.2°F | Ht 66.0 in | Wt 208.0 lb

## 2013-09-03 DIAGNOSIS — I4510 Unspecified right bundle-branch block: Secondary | ICD-10-CM | POA: Diagnosis not present

## 2013-09-03 DIAGNOSIS — M545 Low back pain, unspecified: Secondary | ICD-10-CM

## 2013-09-03 DIAGNOSIS — F17200 Nicotine dependence, unspecified, uncomplicated: Secondary | ICD-10-CM | POA: Diagnosis not present

## 2013-09-03 DIAGNOSIS — E663 Overweight: Secondary | ICD-10-CM

## 2013-09-03 DIAGNOSIS — E7800 Pure hypercholesterolemia, unspecified: Secondary | ICD-10-CM

## 2013-09-03 DIAGNOSIS — I10 Essential (primary) hypertension: Secondary | ICD-10-CM | POA: Diagnosis not present

## 2013-09-03 DIAGNOSIS — M1990 Unspecified osteoarthritis, unspecified site: Secondary | ICD-10-CM

## 2013-09-03 MED ORDER — METOPROLOL SUCCINATE ER 50 MG PO TB24: 50.0000 mg | ORAL_TABLET | Freq: Every day | ORAL | Status: DC

## 2013-09-03 MED ORDER — LOSARTAN POTASSIUM-HCTZ 50-12.5 MG PO TABS: 1.0000 | ORAL_TABLET | Freq: Every day | ORAL | Status: DC

## 2013-09-03 NOTE — Patient Instructions (Signed)
Today we updated your med list in our EPIC system...    Continue your current medications the same...  We refilled the meds you requested...  Keep up the good work w/ your diet & try to increase your exercise program...  Call for any questions.Marland Kitchen..Marland Kitchen

## 2013-09-03 NOTE — Progress Notes (Signed)
Subjective:    Patient ID: [name], male    DOB: 1926/05/26, 80 y.o.   MRN: [id number]  HPI 80 y/o BM here for a follow up visit...  he has multiple medical problems as noted below...    ~  August 24, 2011:  312mo ROV & [name] is doing remarkably well at age 28, no new complaints of concerns today...  We reviewed his med bottles today & gave him written refill prescriptions... He declines the season flu vaccines...    Smoker> still consumes one pouch pipe tobacco wkly & asked to cut way back/ quit...    HBP, RBBB> on ASA, MetoprololER50, Hyzaar50-12.5; BP= 122/70 & he feels well & denies CP, palpit, dizzy, SOB, edema, etc...    Chol> on Simva40 + diet rx; FLP check each Aug & all good, at goal, tol rx well, etc...    DJD, LBP> on Tramadol as needed; "I get around pretty good" he says...    Note> he has continued to refuse screening colonoscopy or DRE, but PSA has been wnl...  ~  February 23, 2012:  312mo ROV & Happy continues to do well, his only complaint is some intermittent mild dizziness & we discussed Rx w/ Meclizine prn...  BP controlled on Metop & Hyzaar;  FLP looks good on Simva40;  He has DJD but exercises regularly w/ walking & yard work, uses Tramadol as needed...     We reviewed prob list, meds, xrays and labs> see below for updates >>  CXR 8/13 showed normal heart size, clear lungs, kyphosis & DJD in spine w/ old compression fxs...  LABS 8/13:  FLP- at goals on Simva40; Chems- look good w/ BS=106 A1c=6.2;  CBC- Hg=12.0;  TSH=1.35   ~  August 30, 2012:  312mo ROV & [name] has gained 13# up to 219# today- we discussed diet & exercise program needed; otherw feeling well w/o new complaints or concerns... We reviewed the following medical problems during today's office visit >>     Smoker> still consumes one pouch pipe tobacco wkly & again asked to cut way back/ quit; he has min cough/ phlegm, denies hemoptysis/ SOB/ CP/ etc...    HBP, RBBB> on ASA, MetoprololER50, Hyzaar50-12.5; BP=  118/72 & he feels well & denies CP, palpit, dizzy, SOB, edema, etc...    Chol> on Simva40 + diet rx; last FLP was 8/13 & showed TChol 118, TG 72, HDL 44, LDL 59    DJD, LBP> on Tramadol as needed; "I get around pretty good" he says...    Note> he has continued to refuse screening colonoscopy or DRE, but PSA has been wnl... We reviewed prob list, meds, xrays and labs> see below for updates >> he has repeatedly refused the Flu vaccine...  ~  February 27, 2013:  312mo ROV & [name]'s CC is arthritis pain & mild rash w/ itching; he has Tramadol as needed for the former & we wrote for Lotrisone cream to treat the latter... We reviewed the following medical problems during today's office visit >>     Smoker> still consumes one pouch pipe tobacco wkly & again asked to cut way back/ quit; he has min cough/ phlegm, denies hemoptysis/ SOB/ CP/ etc; f/u CXR 8/14 w/o acute changes...    HBP, RBBB> on ASA, MetoprololER50, Hyzaar50-12.5; BP= 134/68 & he feels well & denies CP, palpit, dizzy, SOB, edema, etc...    Chol> on Simva40 + diet rx; FLP 8/14 showed TChol 141, TG 89, HDL 44, LDL 79  DJD, LBP> on Tramadol as needed; "I get around pretty good" he says...    Note> he has repeatedly refused DRE & colonoscopy (now too old); he denies prob w/ stool or urine & PSA has been wnl...... We reviewed prob list, meds, xrays and labs> see below for updates >>   CXR 8/14 showed borderline heart size, calcif ectatic Ao, clear lungs, degen spondylosis w/ osteophytes...  LABS 8/14:  FLP- at goals on Simva40;  Chems- ok w/ BS=121 Cr=1.4;  CBC- ok w/ Hg=12.6;  TSH=1.64;  VitD=19 & rec mensMVI + VitD2000/d...  ~  September 03, 2013:  55mo ROV & [name] reports a good interval- no new complaints or concerns "I've been blessed" he says...  We reviewed the following medical problems during today's office visit >>     Smoker> still consumes one pouch pipe tobacco wkly & again asked to cut way back/ quit; he has min cough/ phlegm, denies  hemoptysis/ SOB/ CP/ etc; f/u CXR 8/14 w/o acute changes...    HBP, RBBB> on ASA, MetoprololER50, Hyzaar50-12.5; BP= 120/84 & he feels well & denies CP, palpit, dizzy, SOB, edema, etc...    Chol> on Simva40 + diet rx; FLP 8/14 showed TChol 141, TG 89, HDL 44, LDL 79    DJD, LBP> on Tramadol as needed; "I get around pretty good" he says...    Note> he has repeatedly refused DRE & colonoscopy (now too old); he denies prob w/ stool or urine & PSA has been wnl...... We reviewed prob list, meds, xrays and labs> see below for updates >> he declined the 2014 Flu vaccine...          PROBLEM LIST:   GLAUCOMA (ICD-365.9) - [facility]...  PIPE SMOKER (ICD-305.1) - he continues to consume 1 pouch of cherry blend pipe tobacco weekly & still not interested in smoking cessation help... ~  CXR 8/14 showed borderline heart size, calcif ectatic Ao, clear lungs, degen spondylosis w/ osteophytes  HYPERTENSION (ICD-401.9) - controlled on TOPROL XL 50mg /d and HYZAAR 50-12.5/d...  ~  CXR 8/10 showed norm heart size, sl incr markings, NAD.Marland Kitchen.. ~  CXR 8/12 showed basilar scarring, heart at upper limits norm size, DJD in TSpine & shoulders, NAD... ~  2/13:  BP=118/78 doing well- denies HA, fatigue, visual changes, CP, palipit, dizziness, syncope, dyspnea, edema, etc... ~  8/13:  BP= 118/72 & he remains largely asymptomatic... ~  2/14: on ASA, MetoprololER50, Hyzaar50-12.5; BP= 118/72 & he feels well & denies CP, palpit, dizzy, SOB, edema, etc ~  8/14: on ASA, MetoprololER50, Hyzaar50-12.5; BP= 134/68 & he feels well & denies CP, palpit, dizzy, SOB, edema, etc.  RIGHT BUNDLE BRANCH BLOCK (ICD-426.4) - on ASA 81mg /d... old EKG's w/ RBBB, otherw normal... as noted- no CP, palpit, SOB, etc...  HYPERCHOLESTEROLEMIA (ICD-272.0) - on SIMVASTATIN 40mg /d + diet...  ~  FLP 8/08 showed TChol 124, TG 56, HDL 42, LDL 71... continue same Simva40. ~  FLP 8/09 showed TChol 112, TG 72, HDL 30, LDL 68 ~  FLP 8/10  showed TChol 137, TG 85, HDL 44, LDL 76... rec incr exerc, get wt down. ~  FLP 8/11 showed TChol 133, TG 61, HDL 43, LDL 77 ~  FLP 8/12 on Simva40 showed TChol 129, TG 79, HDL 46, LDL 67... Continue same. ~  FLP 8/13 on Simva40 showed TChol 118, TG 72, HDL 44, LDL 59 ~  FLP 8/14 on Simva40 showed TChol 141, TG 89, HDL 44, LDL 79  OVERWEIGHT (  ICD-278.02) - discussed diet + exercise program... ~  weight 8/09 = 202# ~  weight 2/10 = 211#... he knows that he needs to do better. ~  weight 8/10 = 210# ~  weight 2/11 = 209# ~  weight 8/11 = 211#... we reviewed diet & exercise. ~  weight 2/12 = 215# ~  Weight 8/12 = 212#... 5'6"tall... BMI=34 & discussed w/ pt. ~  Weight 2/13 = 207# ~  Weight 8/13 = 206# ~  Weight 2/14 = 219#... We reviewed diet, exercise, wt reduction strategies... ~  Weight 8/14 = 220#  Hx of GERD (ICD-530.81) - no recent symptoms... doing satis overall... we discussed Prilosec vs Zantac OTC Prn.  DEGENERATIVE JOINT DISEASE (ICD-715.90) - using Tramadol Prn (ave 1/d)... he notes some wrist pain- ACE wrap + Tramadol helps.  BACK PAIN, LUMBAR (ICD-724.2)  Hx of ABNORMALITY OF GAIT >> "I get around pretty good" & denies prob w/ ADLs, mows yard, walking & does housework...  VITAMIN D DEFICIENCY >> ~  Labs 8/14 showed Vit D level = 19 & rec to take a men's formula MVI + OTC Vit D 2000u daily...  Hx of ANGIOEDEMA (ICD-995.1) - ? etiology... no recurrence.  SURG/OTH PROC NOT CARRIED OUT BECAUSE PTS DECN (ICD-V64.2) - discussed again... he continues to refuse routine colonoscopy despite his wife's Dx w/ colon cancer 5/11...  Health Maintenance - he refuses the Flu vaccines but had TETANUS shot & PNEUMOVAX Feb2010... He also refuses DRE "as long as I'm doin all right, don't mess w/ it" and his PSA 8/12 = 0.46   Past Surgical History  Procedure Laterality Date  . Cataract surgery  2004/2005    Dr. Cecilie Kicks    Outpatient Encounter Prescriptions as of 09/03/2013  Medication  Sig  . aspirin 81 MG tablet Take 81 mg by mouth daily.    . Cholecalciferol (VITAMIN D) 2000 UNITS CAPS Take 1 capsule by mouth daily.  . clotrimazole-betamethasone (LOTRISONE) cream Apply to rash as needed  . dorzolamide-timolol (COSOPT) 22.3-6.8 MG/ML ophthalmic solution Place 1 drop into both eyes 2 (two) times daily.   Marland Kitchen latanoprost (XALATAN) 0.005 % ophthalmic solution Place 1 drop into both eyes at bedtime.  Marland Kitchen losartan-hydrochlorothiazide (HYZAAR) 50-12.5 MG per tablet Take 1 tablet by mouth daily.  . meclizine (ANTIVERT) 25 MG tablet Take 1/2 to 1 tablet by mouth every 4 hours as needed for dizziness  . metoprolol succinate (TOPROL-XL) 50 MG 24 hr tablet Take 1 tablet (50 mg total) by mouth daily.  . Multiple Vitamins-Minerals (MENS MULTIVITAMIN PLUS PO) Take 1 tablet by mouth daily.  . pilocarpine (PILOCAR) 1 % ophthalmic solution Place 1 drop into the right eye 3 (three) times daily.  . simvastatin (ZOCOR) 40 MG tablet take 1 tablet by mouth at bedtime  . traMADol (ULTRAM) 50 MG tablet take 1 tablet by mouth every 6 hours if needed    Allergies  Allergen Reactions  . Iodine     REACTION: rash---whleps    Current Medications, Allergies, Past Medical History, Past Surgical History, Family History, and Social History were reviewed in [employer] record.    Review of Systems    See HPI - all other systems neg except as noted...  The patient complains of dyspnea on exertion and difficulty walking.  The patient denies anorexia, fever, weight loss, weight gain, vision loss, decreased hearing, hoarseness, chest pain, syncope, peripheral edema, prolonged cough, headaches, hemoptysis, abdominal pain, melena, hematochezia, severe indigestion/heartburn, hematuria, incontinence, muscle weakness, suspicious  skin lesions, transient blindness, depression, unusual weight change, abnormal bleeding, enlarged lymph nodes, and any signs of recurrent angioedema.     Objective:    Physical Exam    WD, WN, 80 y/o BM in NAD... GENERAL:  Alert & oriented; pleasant & cooperative... HEENT:  /AT, EOM-wnl, PERRLA, EACs-clear, TMs-wnl, NOSE-clear, THROAT-clear & wnl. NECK:  Supple w/ fairROM; no JVD; normal carotid impulses w/o bruits; no thyromegaly or nodules palpated; no lymphadenopathy. CHEST:  Clear to P & A; without wheezes/ rales/ or rhonchi heard...  HEART:  Regular Rhythm; without murmurs/ rubs/ or gallops detected... ABDOMEN:  Obese, soft & nontender; normal bowel sounds; no organomegaly or masses palpated... EXT: without deformities, mod arthritic changes; no varicose veins/ venous insuffic/ or edema. NEURO:  CN's intact; +gait abn;  no focal defects... DERM:  No lesions noted; no rash etc...  RADIOLOGY DATA:  Reviewed in the EPIC EMR & discussed w/ the patient...  LABORATORY DATA:  Reviewed in the EPIC EMR & discussed w/ the patient...   Assessment & Plan:    PIPE SMOKER/ Underlying COPD, Chr Bronchitis>  He is not about to quit his pipe; offered smoking cessation counseling & med help but he is not interested; f/u CXR 8/14 w/ bibasilar scarring but NAD...  HBP>  Controlled on ToprolXL & Hyzaar, continue same...  RBBB>  He denies CP, palpit, ch in SOB, etc;  Needs better diet/ exercise...  CHOL>  Stable on Simva40 + diet efforts...  Overweight>  Must get wt down & we reviewed diet & exercise...  DJD>  He has Tramadol for as needed use...  Health Maintenance> he has repeatedly refused DRE & colonoscopy.Marland KitchenMarland Kitchen

## 2013-09-16 ENCOUNTER — Telehealth: Payer: Self-pay | Admitting: *Deleted

## 2013-09-16 NOTE — Telephone Encounter (Signed)
Pt has been set up with [facility]---Dr. Yetta BarreJones  On 02/11/2014.

## 2013-09-17 ENCOUNTER — Ambulatory Visit: Payer: Medicare Other | Admitting: Internal Medicine

## 2013-12-12 ENCOUNTER — Other Ambulatory Visit: Payer: Self-pay | Admitting: Pulmonary Disease

## 2013-12-15 ENCOUNTER — Telehealth: Payer: Self-pay | Admitting: Pulmonary Disease

## 2013-12-15 MED ORDER — TRAMADOL HCL 50 MG PO TABS: ORAL_TABLET | ORAL | Status: DC

## 2013-12-15 NOTE — Telephone Encounter (Signed)
Called spoke w/ pt. Aware RX has been called into the pharm. Nothing further needed

## 2014-01-23 ENCOUNTER — Other Ambulatory Visit: Payer: Self-pay | Admitting: Pulmonary Disease

## 2014-02-11 ENCOUNTER — Other Ambulatory Visit (INDEPENDENT_AMBULATORY_CARE_PROVIDER_SITE_OTHER): Payer: Medicare Other

## 2014-02-11 ENCOUNTER — Ambulatory Visit (INDEPENDENT_AMBULATORY_CARE_PROVIDER_SITE_OTHER): Payer: Medicare Other | Admitting: Internal Medicine

## 2014-02-11 ENCOUNTER — Encounter: Payer: Self-pay | Admitting: Internal Medicine

## 2014-02-11 VITALS — BP 150/73 | HR 62 | Temp 98.3°F | Resp 16 | Ht 66.0 in | Wt 206.0 lb

## 2014-02-11 DIAGNOSIS — M1990 Unspecified osteoarthritis, unspecified site: Secondary | ICD-10-CM

## 2014-02-11 DIAGNOSIS — M10049 Idiopathic gout, unspecified hand: Secondary | ICD-10-CM

## 2014-02-11 DIAGNOSIS — I10 Essential (primary) hypertension: Secondary | ICD-10-CM | POA: Diagnosis not present

## 2014-02-11 DIAGNOSIS — M545 Low back pain, unspecified: Secondary | ICD-10-CM | POA: Diagnosis not present

## 2014-02-11 DIAGNOSIS — E7800 Pure hypercholesterolemia, unspecified: Secondary | ICD-10-CM

## 2014-02-11 DIAGNOSIS — H409 Unspecified glaucoma: Secondary | ICD-10-CM | POA: Diagnosis not present

## 2014-02-11 DIAGNOSIS — M109 Gout, unspecified: Secondary | ICD-10-CM

## 2014-02-11 DIAGNOSIS — Z23 Encounter for immunization: Secondary | ICD-10-CM | POA: Diagnosis not present

## 2014-02-11 DIAGNOSIS — E559 Vitamin D deficiency, unspecified: Secondary | ICD-10-CM

## 2014-02-11 DIAGNOSIS — M1000 Idiopathic gout, unspecified site: Secondary | ICD-10-CM | POA: Insufficient documentation

## 2014-02-11 LAB — COMPREHENSIVE METABOLIC PANEL
ALBUMIN: 3.6 g/dL (ref 3.5–5.2)
ALT: 10 U/L (ref 0–53)
AST: 13 U/L (ref 0–37)
Alkaline Phosphatase: 82 U/L (ref 39–117)
BUN: 16 mg/dL (ref 6–23)
CALCIUM: 9.3 mg/dL (ref 8.4–10.5)
CHLORIDE: 100 meq/L (ref 96–112)
CO2: 28 mEq/L (ref 19–32)
Creatinine, Ser: 1.1 mg/dL (ref 0.4–1.5)
GFR: 78.3 mL/min (ref >60.00)
Glucose, Bld: 118 mg/dL — ABNORMAL HIGH (ref 70–99)
POTASSIUM: 3.7 meq/L (ref 3.5–5.1)
Sodium: 135 mEq/L (ref 135–145)
TOTAL PROTEIN: 7.9 g/dL (ref 6.0–8.3)
Total Bilirubin: 0.4 mg/dL (ref 0.2–1.2)

## 2014-02-11 LAB — URINALYSIS, ROUTINE W REFLEX MICROSCOPIC
BILIRUBIN URINE: NEGATIVE
HGB URINE DIPSTICK: NEGATIVE
KETONES UR: NEGATIVE
LEUKOCYTES UA: NEGATIVE
NITRITE: NEGATIVE
Specific Gravity, Urine: <=1.005 — AB (ref 1.000–1.030)
TOTAL PROTEIN, URINE-UPE24: NEGATIVE
Urine Glucose: NEGATIVE
Urobilinogen, UA: 0.2 (ref 0.0–1.0)
pH: 7.5 (ref 5.0–8.0)

## 2014-02-11 LAB — CBC WITH DIFFERENTIAL/PLATELET
BASOS ABS: 0 10*3/uL (ref 0.0–0.1)
Basophils Relative: 0.4 % (ref 0.0–3.0)
EOS ABS: 0.2 10*3/uL (ref 0.0–0.7)
Eosinophils Relative: 3.1 % (ref 0.0–5.0)
HCT: 34.6 % — ABNORMAL LOW (ref 39.0–52.0)
Hemoglobin: 11.9 g/dL — ABNORMAL LOW (ref 13.0–17.0)
LYMPHS PCT: 25.2 % (ref 12.0–46.0)
Lymphs Abs: 1.3 10*3/uL (ref 0.7–4.0)
MCHC: 34.4 g/dL (ref 30.0–36.0)
MCV: 96.8 fl (ref 78.0–100.0)
Monocytes Absolute: 0.6 10*3/uL (ref 0.1–1.0)
Monocytes Relative: 12.8 % — ABNORMAL HIGH (ref 3.0–12.0)
NEUTROS PCT: 58.5 % (ref 43.0–77.0)
Neutro Abs: 2.9 10*3/uL (ref 1.4–7.7)
PLATELETS: 208 10*3/uL (ref 150.0–400.0)
RBC: 3.57 Mil/uL — ABNORMAL LOW (ref 4.22–5.81)
RDW: 13 % (ref 11.5–15.5)
WBC: 5 10*3/uL (ref 4.0–10.5)

## 2014-02-11 LAB — LIPID PANEL
CHOL/HDL RATIO: 3
Cholesterol: 129 mg/dL (ref 0–200)
HDL: 41.4 mg/dL (ref >39.00)
LDL Cholesterol: 69 mg/dL (ref 0–99)
NONHDL: 87.6
TRIGLYCERIDES: 91 mg/dL (ref 0.0–149.0)
VLDL: 18.2 mg/dL (ref 0.0–40.0)

## 2014-02-11 LAB — TSH: TSH: 1.46 u[IU]/mL (ref 0.35–4.50)

## 2014-02-11 LAB — URIC ACID: Uric Acid, Serum: 8.3 mg/dL — ABNORMAL HIGH (ref 4.0–7.8)

## 2014-02-11 MED ORDER — COLCHICINE 0.6 MG PO TABS: 0.6000 mg | ORAL_TABLET | Freq: Two times a day (BID) | ORAL | Status: DC

## 2014-02-11 MED ORDER — FEBUXOSTAT 40 MG PO TABS: 40.0000 mg | ORAL_TABLET | Freq: Every day | ORAL | Status: DC

## 2014-02-11 MED ORDER — TRAMADOL HCL 50 MG PO TABS: ORAL_TABLET | ORAL | Status: DC

## 2014-02-11 NOTE — Progress Notes (Signed)
Subjective:    Patient ID: [name], male    DOB: 14-Dec-1925, 80 y.o.   MRN: [id number]  Arthritis Presents for follow-up visit. He complains of pain, stiffness, joint swelling and joint warmth. Affected locations include the right wrist, left PIP, left DIP, left ankle and right ankle. His pain is at a severity of 2/10. Associated symptoms include pain at night and pain while resting. Pertinent negatives include no diarrhea, dry eyes, dry mouth, dysuria, fatigue, fever, rash, Raynaud's syndrome, uveitis or weight loss. His past medical history is significant for chronic back pain and osteoarthritis. There is no history of lupus or rheumatoid arthritis.  His pertinent risk factors include overuse. Past treatments include NSAIDs. The treatment provided mild relief.      Review of Systems  Constitutional: Negative.  Negative for fever, chills, weight loss, diaphoresis, appetite change and fatigue.  HENT: Negative.   Eyes: Negative.   Respiratory: Negative.   Cardiovascular: Negative.  Negative for chest pain, palpitations and leg swelling.  Gastrointestinal: Negative.  Negative for nausea, vomiting, abdominal pain, diarrhea and constipation.  Endocrine: Negative.  Negative for polydipsia, polyphagia and polyuria.  Genitourinary: Negative.  Negative for dysuria.  Musculoskeletal: Positive for arthralgias, arthritis, back pain, joint swelling and stiffness. Negative for gait problem, myalgias, neck pain and neck stiffness.  Skin: Negative.  Negative for rash.  Allergic/Immunologic: Negative.   Neurological: Negative.   Hematological: Negative.  Negative for adenopathy. Does not bruise/bleed easily.  Psychiatric/Behavioral: Negative.        Objective:   Physical Exam  Vitals reviewed. Constitutional: He is oriented to person, place, and time. He appears well-developed and well-nourished. No distress.  HENT:  Head: Normocephalic and atraumatic.  Mouth/Throat: Oropharynx is clear  and moist. No oropharyngeal exudate.  Eyes: Conjunctivae are normal. Right eye exhibits no discharge. Left eye exhibits no discharge. No scleral icterus.  Neck: Normal range of motion. Neck supple. No JVD present. No tracheal deviation present. No thyromegaly present.  Cardiovascular: Normal rate, regular rhythm, normal heart sounds and intact distal pulses.  Exam reveals no gallop and no friction rub.   No murmur heard. Pulmonary/Chest: Effort normal and breath sounds normal. No stridor. No respiratory distress. He has no wheezes. He has no rales. He exhibits no tenderness.  Abdominal: Soft. Bowel sounds are normal. He exhibits no distension and no mass. There is no tenderness. There is no rebound and no guarding.  Musculoskeletal: Normal range of motion. He exhibits no edema and no tenderness.       Right wrist: He exhibits swelling and deformity. He exhibits normal range of motion, no bony tenderness, no effusion, no crepitus and no laceration.       Left hand: He exhibits deformity. He exhibits normal range of motion, no tenderness, no bony tenderness, normal capillary refill and no laceration. Normal sensation noted. Normal strength noted.  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 There is swelling, warmth, and tenderness over the left wrist. In the knees there is evidence of DJD.  Lymphadenopathy:    He has no cervical adenopathy.  Neurological: He is oriented to person, place, and time.  Skin: Skin is warm and dry. No rash noted. He is not diaphoretic. No erythema. No pallor.      Lab Results  Component Value Date   WBC 6.1 02/27/2013   HGB 12.6* 02/27/2013   HCT 36.7* 02/27/2013   PLT 190.0 02/27/2013   GLUCOSE 121*  02/27/2013   CHOL 141 02/27/2013   TRIG 89.0 02/27/2013   HDL 43.90 02/27/2013   LDLCALC 79 02/27/2013   ALT 11 02/27/2013   AST 16 02/27/2013   NA 137 02/27/2013   K 4.0 02/27/2013   CL 104 02/27/2013   CREATININE 1.4 02/27/2013     BUN 20 02/27/2013   CO2 27 02/27/2013   TSH 1.64 02/27/2013   PSA 0.46 02/16/2011   HGBA1C 6.2 02/23/2012      Assessment & Plan:

## 2014-02-11 NOTE — Patient Instructions (Addendum)

## 2014-02-11 NOTE — Progress Notes (Signed)
Pre visit review using our clinic review tool, if applicable. No additional management support is needed unless otherwise documented below in the visit note. 

## 2014-02-12 DIAGNOSIS — E559 Vitamin D deficiency, unspecified: Secondary | ICD-10-CM | POA: Insufficient documentation

## 2014-02-12 MED ORDER — VITAMIN D 50 MCG (2000 UT) PO CAPS: 1.0000 | ORAL_CAPSULE | Freq: Every day | ORAL | Status: DC

## 2014-02-12 NOTE — Assessment & Plan Note (Signed)
Will cont tramadol as needed for pain 

## 2014-02-12 NOTE — Assessment & Plan Note (Signed)
His BP is adequately well controlled 

## 2014-02-12 NOTE — Assessment & Plan Note (Signed)
He has achieved his LDL goal 

## 2014-02-12 NOTE — Assessment & Plan Note (Signed)
Will start uloric and colchicine

## 2014-02-12 NOTE — Assessment & Plan Note (Signed)
I have asked him to restart a Vit D supplement

## 2014-03-09 DIAGNOSIS — H409 Unspecified glaucoma: Secondary | ICD-10-CM | POA: Diagnosis not present

## 2014-03-09 DIAGNOSIS — H4011X0 Primary open-angle glaucoma, stage unspecified: Secondary | ICD-10-CM | POA: Diagnosis not present

## 2014-04-23 ENCOUNTER — Telehealth: Payer: Self-pay | Admitting: Internal Medicine

## 2014-04-23 DIAGNOSIS — M1000 Idiopathic gout, unspecified site: Secondary | ICD-10-CM

## 2014-04-23 MED ORDER — ALLOPURINOL 100 MG PO TABS: 100.0000 mg | ORAL_TABLET | Freq: Every day | ORAL | Status: DC

## 2014-04-23 NOTE — Telephone Encounter (Signed)
Patient can not afford colchicine and uloric.  He is requesting something else to be called in to [facility] [address] that would be cheaper.

## 2014-04-23 NOTE — Telephone Encounter (Signed)
Changed to a generic 

## 2014-06-12 ENCOUNTER — Ambulatory Visit (INDEPENDENT_AMBULATORY_CARE_PROVIDER_SITE_OTHER): Payer: Medicare Other | Admitting: Internal Medicine

## 2014-06-12 ENCOUNTER — Encounter: Payer: Self-pay | Admitting: Internal Medicine

## 2014-06-12 ENCOUNTER — Other Ambulatory Visit (INDEPENDENT_AMBULATORY_CARE_PROVIDER_SITE_OTHER): Payer: Medicare Other

## 2014-06-12 VITALS — BP 120/70 | HR 66 | Temp 97.5°F | Resp 16 | Ht 66.0 in | Wt 200.0 lb

## 2014-06-12 DIAGNOSIS — I10 Essential (primary) hypertension: Secondary | ICD-10-CM | POA: Diagnosis not present

## 2014-06-12 DIAGNOSIS — M1000 Idiopathic gout, unspecified site: Secondary | ICD-10-CM

## 2014-06-12 NOTE — Patient Instructions (Signed)

## 2014-06-12 NOTE — Progress Notes (Signed)
Pre visit review using our clinic review tool, if applicable. No additional management support is needed unless otherwise documented below in the visit note. 

## 2014-06-13 NOTE — Assessment & Plan Note (Signed)
Will recheck his uric acid level today Goal is <6 Will cont current meds

## 2014-06-13 NOTE — Progress Notes (Signed)
   Subjective:    Patient ID: Maralyn Sagolyde Brave, male    DOB: 1925-09-20, 80 y.o.   MRN: [id number]  Hypertension This is a chronic problem. The current episode started more than 1 year ago. The problem has been gradually improving since onset. The problem is controlled. Pertinent negatives include no anxiety, blurred vision, chest pain, headaches, malaise/fatigue, neck pain, orthopnea, palpitations, peripheral edema, PND, shortness of breath or sweats. Past treatments include angiotensin blockers, diuretics and beta blockers. The current treatment provides moderate improvement. There are no compliance problems.       Review of Systems  Constitutional: Negative.  Negative for fever, chills, malaise/fatigue, diaphoresis, appetite change and fatigue.  HENT: Negative.   Eyes: Negative.  Negative for blurred vision.  Respiratory: Negative.  Negative for cough, choking, chest tightness, shortness of breath and stridor.   Cardiovascular: Negative.  Negative for chest pain, palpitations, orthopnea, leg swelling and PND.  Gastrointestinal: Negative.  Negative for nausea, vomiting, abdominal pain, diarrhea, constipation and blood in stool.  Endocrine: Negative.   Genitourinary: Negative.   Musculoskeletal: Positive for arthralgias (rt wrist). Negative for myalgias, back pain, joint swelling, neck pain and neck stiffness.  Skin: Negative.  Negative for rash.  Allergic/Immunologic: Negative.   Neurological: Negative.  Negative for headaches.  Hematological: Negative.  Negative for adenopathy. Does not bruise/bleed easily.  Psychiatric/Behavioral: Negative.        Objective:   Physical Exam  Constitutional: He is oriented to person, place, and time. He appears well-developed and well-nourished. No distress.  HENT:  Head: Normocephalic and atraumatic.  Mouth/Throat: Oropharynx is clear and moist. No oropharyngeal exudate.  Eyes: Conjunctivae are normal. Right eye exhibits no discharge. Left eye  exhibits no discharge. No scleral icterus.  Neck: Normal range of motion. Neck supple. No JVD present. No tracheal deviation present. No thyromegaly present.  Cardiovascular: Normal rate, regular rhythm, normal heart sounds and intact distal pulses.  Exam reveals no gallop and no friction rub.   No murmur heard. Pulmonary/Chest: Effort normal and breath sounds normal. No stridor. No respiratory distress. He has no wheezes. He has no rales. He exhibits no tenderness.  Abdominal: Soft. Bowel sounds are normal. He exhibits no distension and no mass. There is no tenderness. There is no rebound and no guarding.  Musculoskeletal: Normal range of motion. He exhibits no edema or tenderness.  Lymphadenopathy:    He has no cervical adenopathy.  Neurological: He is oriented to person, place, and time.  Skin: Skin is warm and dry. No rash noted. He is not diaphoretic. No erythema. No pallor.  Vitals reviewed.    Lab Results  Component Value Date   WBC 5.0 02/11/2014   HGB 11.9* 02/11/2014   HCT 34.6* 02/11/2014   PLT 208.0 02/11/2014   GLUCOSE 118* 02/11/2014   CHOL 129 02/11/2014   TRIG 91.0 02/11/2014   HDL 41.40 02/11/2014   LDLCALC 69 02/11/2014   ALT 10 02/11/2014   AST 13 02/11/2014   NA 135 02/11/2014   K 3.7 02/11/2014   CL 100 02/11/2014   CREATININE 1.1 02/11/2014   BUN 16 02/11/2014   CO2 28 02/11/2014   TSH 1.46 02/11/2014   PSA 0.46 02/16/2011   HGBA1C 6.2 02/23/2012       Assessment & Plan:

## 2014-06-13 NOTE — Assessment & Plan Note (Signed)
His BP is well controlled I will monitor his lytes and renal function today 

## 2014-06-14 LAB — BASIC METABOLIC PANEL
BUN: 21 mg/dL (ref 6–23)
CO2: 28 meq/L (ref 19–32)
Calcium: 9.4 mg/dL (ref 8.4–10.5)
Chloride: 103 mEq/L (ref 96–112)
Creatinine, Ser: 1.3 mg/dL (ref 0.4–1.5)
GFR: 69.7 mL/min (ref >60.00)
Glucose, Bld: 91 mg/dL (ref 70–99)
Potassium: 3.9 mEq/L (ref 3.5–5.1)
SODIUM: 137 meq/L (ref 135–145)

## 2014-06-14 LAB — URIC ACID: Uric Acid, Serum: 6.2 mg/dL (ref 4.0–7.8)

## 2014-06-15 ENCOUNTER — Telehealth: Payer: Self-pay | Admitting: Internal Medicine

## 2014-06-15 NOTE — Telephone Encounter (Signed)
emmi mailed  °

## 2014-06-22 ENCOUNTER — Other Ambulatory Visit: Payer: Self-pay | Admitting: Pulmonary Disease

## 2014-06-25 ENCOUNTER — Telehealth: Payer: Self-pay | Admitting: Internal Medicine

## 2014-06-25 ENCOUNTER — Other Ambulatory Visit: Payer: Self-pay | Admitting: *Deleted

## 2014-06-25 MED ORDER — SIMVASTATIN 40 MG PO TABS: 40.0000 mg | ORAL_TABLET | Freq: Every day | ORAL | Status: DC

## 2014-06-25 NOTE — Telephone Encounter (Signed)
[facility] [address]   Patient states he is going to have some other meds run out soon but he is not sure which meds it is.

## 2014-06-26 ENCOUNTER — Other Ambulatory Visit: Payer: Self-pay | Admitting: *Deleted

## 2014-06-26 MED ORDER — SIMVASTATIN 40 MG PO TABS: 40.0000 mg | ORAL_TABLET | Freq: Every day | ORAL | Status: DC

## 2014-08-14 DIAGNOSIS — H4011X3 Primary open-angle glaucoma, severe stage: Secondary | ICD-10-CM | POA: Diagnosis not present

## 2014-09-04 ENCOUNTER — Telehealth: Payer: Self-pay | Admitting: Pulmonary Disease

## 2014-09-07 NOTE — Telephone Encounter (Signed)
Pt called in and needs refills on his:  losartan-hydrochlorothiazide (HYZAAR) 50-12.5 MG per tablet [[id number]][[id number]]  Metoprolol  50mg    [facility] [address]

## 2014-09-07 NOTE — Telephone Encounter (Signed)
[facility] refills to [facility]...Raechel Chute/lmb

## 2014-09-30 DIAGNOSIS — H4011X3 Primary open-angle glaucoma, severe stage: Secondary | ICD-10-CM | POA: Diagnosis not present

## 2014-11-27 ENCOUNTER — Telehealth: Payer: Self-pay | Admitting: Internal Medicine

## 2014-11-27 DIAGNOSIS — M545 Low back pain, unspecified: Secondary | ICD-10-CM

## 2014-11-27 DIAGNOSIS — M1990 Unspecified osteoarthritis, unspecified site: Secondary | ICD-10-CM

## 2014-11-27 DIAGNOSIS — M10049 Idiopathic gout, unspecified hand: Secondary | ICD-10-CM

## 2014-11-27 MED ORDER — SIMVASTATIN 40 MG PO TABS: 40.0000 mg | ORAL_TABLET | Freq: Every day | ORAL | Status: DC

## 2014-11-27 MED ORDER — TRAMADOL HCL 50 MG PO TABS: 50.0000 mg | ORAL_TABLET | Freq: Every day | ORAL | Status: DC | PRN

## 2014-11-27 NOTE — Telephone Encounter (Signed)
MD out of office pls advise on tramadol...Raechel Chute/lmb

## 2014-11-27 NOTE — Telephone Encounter (Signed)
Notified pt rx's sent to [facility].../lmb 

## 2014-11-27 NOTE — Telephone Encounter (Signed)
Patient requesting refill simvastatin (ZOCOR) 40 MG tablet [[id number]][[id number]] he is just about out and traMADol (ULTRAM) 50 MG tablet [[id number]][[id number]] [facility] [address]

## 2014-11-27 NOTE — Telephone Encounter (Signed)
Since not filled in some time will decrease quantity and frequency to what he likely has been doing.

## 2014-12-14 ENCOUNTER — Ambulatory Visit (INDEPENDENT_AMBULATORY_CARE_PROVIDER_SITE_OTHER): Payer: Medicare Other | Admitting: Internal Medicine

## 2014-12-14 ENCOUNTER — Other Ambulatory Visit (INDEPENDENT_AMBULATORY_CARE_PROVIDER_SITE_OTHER): Payer: Medicare Other

## 2014-12-14 ENCOUNTER — Encounter: Payer: Self-pay | Admitting: Internal Medicine

## 2014-12-14 VITALS — BP 138/72 | HR 62 | Temp 97.8°F | Resp 16 | Ht 66.0 in | Wt 202.0 lb

## 2014-12-14 DIAGNOSIS — M1990 Unspecified osteoarthritis, unspecified site: Secondary | ICD-10-CM

## 2014-12-14 DIAGNOSIS — M1000 Idiopathic gout, unspecified site: Secondary | ICD-10-CM

## 2014-12-14 DIAGNOSIS — E7800 Pure hypercholesterolemia, unspecified: Secondary | ICD-10-CM

## 2014-12-14 DIAGNOSIS — M150 Primary generalized (osteo)arthritis: Secondary | ICD-10-CM

## 2014-12-14 DIAGNOSIS — I10 Essential (primary) hypertension: Secondary | ICD-10-CM | POA: Diagnosis not present

## 2014-12-14 DIAGNOSIS — R7301 Impaired fasting glucose: Secondary | ICD-10-CM

## 2014-12-14 DIAGNOSIS — M545 Low back pain, unspecified: Secondary | ICD-10-CM

## 2014-12-14 DIAGNOSIS — M159 Polyosteoarthritis, unspecified: Secondary | ICD-10-CM

## 2014-12-14 DIAGNOSIS — M10049 Idiopathic gout, unspecified hand: Secondary | ICD-10-CM

## 2014-12-14 DIAGNOSIS — B353 Tinea pedis: Secondary | ICD-10-CM

## 2014-12-14 LAB — COMPREHENSIVE METABOLIC PANEL
ALBUMIN: 3.9 g/dL (ref 3.5–5.2)
ALK PHOS: 70 U/L (ref 39–117)
ALT: 43 U/L (ref 0–53)
AST: 32 U/L (ref 0–37)
BILIRUBIN TOTAL: 0.5 mg/dL (ref 0.2–1.2)
BUN: 21 mg/dL (ref 6–23)
CHLORIDE: 102 meq/L (ref 96–112)
CO2: 27 meq/L (ref 19–32)
CREATININE: 1.29 mg/dL (ref 0.40–1.50)
Calcium: 9.2 mg/dL (ref 8.4–10.5)
GFR: 67.75 mL/min (ref >60.00)
Glucose, Bld: 129 mg/dL — ABNORMAL HIGH (ref 70–99)
Potassium: 3.8 mEq/L (ref 3.5–5.1)
SODIUM: 134 meq/L — AB (ref 135–145)
Total Protein: 7 g/dL (ref 6.0–8.3)

## 2014-12-14 LAB — LIPID PANEL
CHOL/HDL RATIO: 3
Cholesterol: 110 mg/dL (ref 0–200)
HDL: 42.8 mg/dL (ref >39.00)
LDL CALC: 51 mg/dL (ref 0–99)
NonHDL: 67.2
Triglycerides: 83 mg/dL (ref 0.0–149.0)
VLDL: 16.6 mg/dL (ref 0.0–40.0)

## 2014-12-14 LAB — CBC WITH DIFFERENTIAL/PLATELET
BASOS ABS: 0 10*3/uL (ref 0.0–0.1)
BASOS PCT: 0.3 % (ref 0.0–3.0)
EOS ABS: 0.4 10*3/uL (ref 0.0–0.7)
Eosinophils Relative: 8.7 % — ABNORMAL HIGH (ref 0.0–5.0)
HCT: 35.6 % — ABNORMAL LOW (ref 39.0–52.0)
Hemoglobin: 12.1 g/dL — ABNORMAL LOW (ref 13.0–17.0)
LYMPHS ABS: 1.4 10*3/uL (ref 0.7–4.0)
Lymphocytes Relative: 31.6 % (ref 12.0–46.0)
MCHC: 33.9 g/dL (ref 30.0–36.0)
MCV: 100.1 fl — ABNORMAL HIGH (ref 78.0–100.0)
MONO ABS: 0.6 10*3/uL (ref 0.1–1.0)
MONOS PCT: 14 % — AB (ref 3.0–12.0)
NEUTROS ABS: 2 10*3/uL (ref 1.4–7.7)
Neutrophils Relative %: 45.4 % (ref 43.0–77.0)
Platelets: 159 10*3/uL (ref 150.0–400.0)
RBC: 3.55 Mil/uL — AB (ref 4.22–5.81)
RDW: 14.9 % (ref 11.5–15.5)
WBC: 4.4 10*3/uL (ref 4.0–10.5)

## 2014-12-14 LAB — HEMOGLOBIN A1C: HEMOGLOBIN A1C: 6.6 % — AB (ref 4.6–6.5)

## 2014-12-14 LAB — TSH: TSH: 2.94 u[IU]/mL (ref 0.35–4.50)

## 2014-12-14 MED ORDER — CLOTRIMAZOLE 1 % EX CREA: 1.0000 "application " | TOPICAL_CREAM | Freq: Two times a day (BID) | CUTANEOUS | Status: DC

## 2014-12-14 MED ORDER — TRAMADOL HCL 50 MG PO TABS: 50.0000 mg | ORAL_TABLET | Freq: Every day | ORAL | Status: DC | PRN

## 2014-12-14 NOTE — Patient Instructions (Signed)

## 2014-12-14 NOTE — Progress Notes (Signed)
Subjective:  Patient ID: [name], male    DOB: September 07, 1925  Age: 81 y.o. MRN: [id number]  CC: Hypertension; Osteoarthritis; and Rash   HPI [name] presents for a routine f/up on DJD, gout, and DM2. He requests a refill on tramadol as he has recurrent episodes of right wrist pain. He also complains of an itchy rash between his toes.  Outpatient Prescriptions Prior to Visit  Medication Sig Dispense Refill  . allopurinol (ZYLOPRIM) 100 MG tablet Take 1 tablet (100 mg total) by mouth daily. 30 tablet 11  . aspirin 81 MG tablet Take 81 mg by mouth daily.      . Cholecalciferol (VITAMIN D) 2000 UNITS CAPS Take 1 capsule (2,000 Units total) by mouth daily. 90 capsule 3  . colchicine 0.6 MG tablet Take 1 tablet (0.6 mg total) by mouth 2 (two) times daily. 60 tablet 11  . dorzolamide-timolol (COSOPT) 22.3-6.8 MG/ML ophthalmic solution Place 1 drop into both eyes 2 (two) times daily.     Marland Kitchen latanoprost (XALATAN) 0.005 % ophthalmic solution Place 1 drop into both eyes at bedtime.    Marland Kitchen losartan-hydrochlorothiazide (HYZAAR) 50-12.5 MG per tablet take 1 tablet by mouth once daily 30 tablet 11  . meclizine (ANTIVERT) 25 MG tablet Take 1/2 to 1 tablet by mouth every 4 hours as needed for dizziness 50 tablet 6  . metoprolol succinate (TOPROL-XL) 50 MG 24 hr tablet take 1 tablet by mouth once daily 30 tablet 11  . pilocarpine (PILOCAR) 1 % ophthalmic solution Place 1 drop into the right eye 3 (three) times daily.    . simvastatin (ZOCOR) 40 MG tablet Take 1 tablet (40 mg total) by mouth at bedtime. 30 tablet 5  . traMADol (ULTRAM) 50 MG tablet Take 1 tablet (50 mg total) by mouth daily as needed. take 1 tablet by mouth every 6 hours if needed 30 tablet 0   No facility-administered medications prior to visit.    ROS Review of Systems  Constitutional: Negative.  Negative for fever, chills, diaphoresis, appetite change and fatigue.  HENT: Negative.   Eyes: Negative.   Respiratory: Negative.   Negative for cough, choking, chest tightness, shortness of breath and stridor.   Cardiovascular: Negative.  Negative for chest pain, palpitations and leg swelling.  Gastrointestinal: Negative.  Negative for nausea, vomiting, abdominal pain, diarrhea, constipation and blood in stool.  Endocrine: Negative.   Genitourinary: Negative.   Musculoskeletal: Positive for back pain and arthralgias (rt wrist and rt hand). Negative for myalgias, joint swelling, gait problem, neck pain and neck stiffness.  Skin: Negative.  Negative for rash.  Allergic/Immunologic: Negative.   Neurological: Negative.  Negative for dizziness, tremors, syncope, speech difficulty, weakness, light-headedness, numbness and headaches.  Hematological: Negative.  Negative for adenopathy. Does not bruise/bleed easily.  Psychiatric/Behavioral: Negative.     Objective:  BP 138/72 mmHg  Pulse 62  Temp(Src) 97.8 F (36.6 C) (Oral)  Resp 16  Ht  (1.676 m)  Wt 202 lb (91.627 kg)  BMI 32.62 kg/m2  SpO2 97%  BP Readings from Last 3 Encounters:  12/14/14 138/72  06/12/14 120/70  02/11/14 150/73    Wt Readings from Last 3 Encounters:  12/14/14 202 lb (91.627 kg)  06/12/14 200 lb (90.719 kg)  02/11/14 206 lb (93.441 kg)    Physical Exam  Constitutional: He is oriented to person, place, and time. He appears well-developed and well-nourished. No distress.  HENT:  Head: Normocephalic and atraumatic.  Mouth/Throat: Oropharynx is clear and moist.  No oropharyngeal exudate.  Eyes: Conjunctivae are normal. Right eye exhibits no discharge. Left eye exhibits no discharge. No scleral icterus.  Neck: Normal range of motion. Neck supple. No JVD present. No tracheal deviation present. No thyromegaly present.  Cardiovascular: Normal rate, regular rhythm, normal heart sounds and intact distal pulses.  Exam reveals no gallop and no friction rub.   No murmur heard. Pulmonary/Chest: Effort normal and breath sounds normal. No stridor.  No respiratory distress. He has no wheezes. He has no rales. He exhibits no tenderness.  Abdominal: Soft. Bowel sounds are normal. He exhibits no distension and no mass. There is no tenderness. There is no rebound and no guarding.  Musculoskeletal: Normal range of motion. He exhibits no edema.       Right wrist: He exhibits swelling (mild) and deformity (DJD changes). He exhibits normal range of motion, no tenderness, no bony tenderness, no effusion, no crepitus and no laceration.       Right hand: He exhibits deformity (DJD in the MCP joints). He exhibits normal range of motion, no tenderness, no bony tenderness, normal two-point discrimination, normal capillary refill, no laceration and no swelling. Normal sensation noted. Normal strength noted.  Lymphadenopathy:    He has no cervical adenopathy.  Neurological: He is oriented to person, place, and time.  Skin: Skin is warm and dry. Rash noted. He is not diaphoretic. No erythema. No pallor.  There is scale, maceration, fissuring in the web spaces of his toes.  Vitals reviewed.   Lab Results  Component Value Date   WBC 4.4 12/14/2014   HGB 12.1* 12/14/2014   HCT 35.6* 12/14/2014   PLT 159.0 12/14/2014   GLUCOSE 129* 12/14/2014   CHOL 110 12/14/2014   TRIG 83.0 12/14/2014   HDL 42.80 12/14/2014   LDLCALC 51 12/14/2014   ALT 43 12/14/2014   AST 32 12/14/2014   NA 134* 12/14/2014   K 3.8 12/14/2014   CL 102 12/14/2014   CREATININE 1.29 12/14/2014   BUN 21 12/14/2014   CO2 27 12/14/2014   TSH 2.94 12/14/2014   PSA 0.46 02/16/2011   HGBA1C 6.6* 12/14/2014    Dg Chest 2 View  02/27/2013   *RADIOLOGY REPORT*  Clinical Data: History of pipe smoking.  History of hypertension. No chest complaints.  CHEST - 2 VIEW  Comparison: 02/23/2012.  Findings: There is mild cardiac silhouette enlargement. Ectasia and nonaneurysmal calcification of the thoracic aorta are seen. Mediastinal and hilar contours appear stable. No pleural abnormality is  evident.  No active pulmonary infiltrates are seen. No pulmonary masses are evident.  There is narrowing of some of the intervertebral disc spaces.  Multilevel marginal osteophyte formation is seen representing degenerative spondylosis.  IMPRESSION: Mild cardiac silhouette enlargement which is slightly larger than on prior study.  No acute or active pulmonary or pleural abnormalities are seen.  Chronic bony findings are described above.   Original Report Authenticated By: Onalee Hua Call    Assessment & Plan:   Monroe was seen today for hypertension, osteoarthritis and rash.  Diagnoses and all orders for this visit:  Essential hypertension - his BP is well controlled,, lytes and renal function are stable Orders: -     Comprehensive metabolic panel; Future -     CBC with Differential/Platelet; Future  Impaired fasting glucose - he has mild type 2 DM, meds are not needed at this time Orders: -     Comprehensive metabolic panel; Future -     Hemoglobin A1c; Future  HYPERCHOLESTEROLEMIA -  he has achieved his LDL goal Orders: -     Lipid panel; Future -     Comprehensive metabolic panel; Future -     TSH; Future  Primary osteoarthritis involving multiple joints Orders: -     traMADol (ULTRAM) 50 MG tablet; Take 1 tablet (50 mg total) by mouth daily as needed. take 1 tablet by mouth every 6 hours if needed  Idiopathic gout, unspecified chronicity, unspecified site Orders: -     traMADol (ULTRAM) 50 MG tablet; Take 1 tablet (50 mg total) by mouth daily as needed. take 1 tablet by mouth every 6 hours if needed  Tinea pedis of both feet Orders: -     clotrimazole (LOTRIMIN) 1 % cream; Apply 1 application topically 2 (two) times daily.  Midline low back pain without sciatica Orders: -     traMADol (ULTRAM) 50 MG tablet; Take 1 tablet (50 mg total) by mouth daily as needed. take 1 tablet by mouth every 6 hours if needed  Osteoarthritis, unspecified osteoarthritis type, unspecified  site  Idiopathic gout of hand, unspecified chronicity, unspecified laterality  I am having [name] maintain his aspirin, dorzolamide-timolol, latanoprost, meclizine, pilocarpine, colchicine, Vitamin D, allopurinol, losartan-hydrochlorothiazide, metoprolol succinate, simvastatin, clotrimazole, and traMADol.  Meds ordered this encounter  Medications  . DISCONTD: clotrimazole (LOTRIMIN) 1 % cream    Sig: Apply 1 application topically 2 (two) times daily.  . clotrimazole (LOTRIMIN) 1 % cream    Sig: Apply 1 application topically 2 (two) times daily.    Dispense:  60 g    Refill:  2  . traMADol (ULTRAM) 50 MG tablet    Sig: Take 1 tablet (50 mg total) by mouth daily as needed. take 1 tablet by mouth every 6 hours if needed    Dispense:  75 tablet    Refill:  5     Follow-up: Return in about 6 months (around 06/15/2015).  Sanda Lingerhomas Maricel Swartzendruber, MD

## 2014-12-30 DIAGNOSIS — H4011X3 Primary open-angle glaucoma, severe stage: Secondary | ICD-10-CM | POA: Diagnosis not present

## 2015-02-18 ENCOUNTER — Other Ambulatory Visit: Payer: Self-pay | Admitting: Geriatric Medicine

## 2015-02-18 DIAGNOSIS — M10049 Idiopathic gout, unspecified hand: Secondary | ICD-10-CM

## 2015-02-18 MED ORDER — COLCHICINE 0.6 MG PO TABS: 0.6000 mg | ORAL_TABLET | Freq: Two times a day (BID) | ORAL | Status: DC

## 2015-02-26 ENCOUNTER — Other Ambulatory Visit: Payer: Self-pay

## 2015-02-26 DIAGNOSIS — E559 Vitamin D deficiency, unspecified: Secondary | ICD-10-CM

## 2015-02-26 MED ORDER — VITAMIN D 50 MCG (2000 UT) PO CAPS: 1.0000 | ORAL_CAPSULE | Freq: Every day | ORAL | Status: DC

## 2015-03-08 DIAGNOSIS — H4011X3 Primary open-angle glaucoma, severe stage: Secondary | ICD-10-CM | POA: Diagnosis not present

## 2015-04-20 ENCOUNTER — Telehealth: Payer: Self-pay | Admitting: Internal Medicine

## 2015-04-20 DIAGNOSIS — M1000 Idiopathic gout, unspecified site: Secondary | ICD-10-CM

## 2015-04-20 NOTE — Telephone Encounter (Signed)
Pt called in and needs a refill on his allopurinol (ZYLOPRIM) 100 MG tablet [[id number]].  Can this be refilled?

## 2015-04-21 MED ORDER — ALLOPURINOL 100 MG PO TABS: 100.0000 mg | ORAL_TABLET | Freq: Every day | ORAL | Status: DC

## 2015-05-05 DIAGNOSIS — H401133 Primary open-angle glaucoma, bilateral, severe stage: Secondary | ICD-10-CM | POA: Diagnosis not present

## 2015-05-18 LAB — HM DIABETES EYE EXAM

## 2015-05-31 ENCOUNTER — Telehealth: Payer: Self-pay | Admitting: Internal Medicine

## 2015-05-31 ENCOUNTER — Other Ambulatory Visit: Payer: Self-pay

## 2015-05-31 MED ORDER — SIMVASTATIN 40 MG PO TABS: 40.0000 mg | ORAL_TABLET | Freq: Every day | ORAL | Status: DC

## 2015-05-31 NOTE — Telephone Encounter (Signed)
Patient medication was refilled and sent to the pharmacy for pick up.

## 2015-05-31 NOTE — Telephone Encounter (Signed)
Patient requesting refill for simvastatin (ZOCOR) 40 MG tablet [[id number] Pharmacy is [facility] on [address]

## 2015-06-15 ENCOUNTER — Other Ambulatory Visit (INDEPENDENT_AMBULATORY_CARE_PROVIDER_SITE_OTHER): Payer: Medicare Other

## 2015-06-15 ENCOUNTER — Encounter: Payer: Self-pay | Admitting: Internal Medicine

## 2015-06-15 ENCOUNTER — Ambulatory Visit (INDEPENDENT_AMBULATORY_CARE_PROVIDER_SITE_OTHER): Payer: Medicare Other | Admitting: Internal Medicine

## 2015-06-15 VITALS — BP 134/70 | HR 56 | Temp 97.8°F | Resp 16 | Ht 66.0 in | Wt 204.0 lb

## 2015-06-15 DIAGNOSIS — M545 Low back pain, unspecified: Secondary | ICD-10-CM

## 2015-06-15 DIAGNOSIS — E118 Type 2 diabetes mellitus with unspecified complications: Secondary | ICD-10-CM | POA: Diagnosis not present

## 2015-06-15 DIAGNOSIS — D510 Vitamin B12 deficiency anemia due to intrinsic factor deficiency: Secondary | ICD-10-CM

## 2015-06-15 DIAGNOSIS — M150 Primary generalized (osteo)arthritis: Secondary | ICD-10-CM | POA: Diagnosis not present

## 2015-06-15 DIAGNOSIS — I10 Essential (primary) hypertension: Secondary | ICD-10-CM

## 2015-06-15 DIAGNOSIS — M10049 Idiopathic gout, unspecified hand: Secondary | ICD-10-CM

## 2015-06-15 DIAGNOSIS — D649 Anemia, unspecified: Secondary | ICD-10-CM | POA: Insufficient documentation

## 2015-06-15 DIAGNOSIS — R8299 Other abnormal findings in urine: Secondary | ICD-10-CM

## 2015-06-15 DIAGNOSIS — M1000 Idiopathic gout, unspecified site: Secondary | ICD-10-CM

## 2015-06-15 DIAGNOSIS — R8290 Unspecified abnormal findings in urine: Secondary | ICD-10-CM

## 2015-06-15 DIAGNOSIS — M159 Polyosteoarthritis, unspecified: Secondary | ICD-10-CM

## 2015-06-15 DIAGNOSIS — E785 Hyperlipidemia, unspecified: Secondary | ICD-10-CM

## 2015-06-15 LAB — IBC PANEL
Iron: 106 ug/dL (ref 42–165)
Saturation Ratios: 30.2 % (ref 20.0–50.0)
Transferrin: 251 mg/dL (ref 212.0–360.0)

## 2015-06-15 LAB — CBC WITH DIFFERENTIAL/PLATELET
BASOS PCT: 0.3 % (ref 0.0–3.0)
Basophils Absolute: 0 10*3/uL (ref 0.0–0.1)
EOS ABS: 0.6 10*3/uL (ref 0.0–0.7)
Eosinophils Relative: 13.4 % — ABNORMAL HIGH (ref 0.0–5.0)
HEMATOCRIT: 38 % — AB (ref 39.0–52.0)
Hemoglobin: 12.6 g/dL — ABNORMAL LOW (ref 13.0–17.0)
LYMPHS ABS: 1.2 10*3/uL (ref 0.7–4.0)
LYMPHS PCT: 25.7 % (ref 12.0–46.0)
MCHC: 33.2 g/dL (ref 30.0–36.0)
MCV: 100.1 fl — AB (ref 78.0–100.0)
Monocytes Absolute: 0.6 10*3/uL (ref 0.1–1.0)
Monocytes Relative: 13.1 % — ABNORMAL HIGH (ref 3.0–12.0)
NEUTROS ABS: 2.2 10*3/uL (ref 1.4–7.7)
Neutrophils Relative %: 47.5 % (ref 43.0–77.0)
PLATELETS: 169 10*3/uL (ref 150.0–400.0)
RBC: 3.79 Mil/uL — ABNORMAL LOW (ref 4.22–5.81)
RDW: 14.1 % (ref 11.5–15.5)
WBC: 4.6 10*3/uL (ref 4.0–10.5)

## 2015-06-15 LAB — FERRITIN: FERRITIN: 242.2 ng/mL (ref 22.0–322.0)

## 2015-06-15 LAB — URINALYSIS, ROUTINE W REFLEX MICROSCOPIC
Bilirubin Urine: NEGATIVE
Ketones, ur: NEGATIVE
Leukocytes, UA: NEGATIVE
Nitrite: NEGATIVE
Specific Gravity, Urine: 1.015 (ref 1.000–1.030)
Total Protein, Urine: NEGATIVE
Urine Glucose: NEGATIVE
Urobilinogen, UA: 0.2 (ref 0.0–1.0)
pH: 7 (ref 5.0–8.0)

## 2015-06-15 LAB — BASIC METABOLIC PANEL
BUN: 25 mg/dL — AB (ref 6–23)
CO2: 27 mEq/L (ref 19–32)
CREATININE: 1.32 mg/dL (ref 0.40–1.50)
Calcium: 9.5 mg/dL (ref 8.4–10.5)
Chloride: 104 mEq/L (ref 96–112)
GFR: 65.9 mL/min (ref >60.00)
Glucose, Bld: 126 mg/dL — ABNORMAL HIGH (ref 70–99)
Potassium: 3.9 mEq/L (ref 3.5–5.1)
Sodium: 138 mEq/L (ref 135–145)

## 2015-06-15 LAB — RETICULOCYTES
ABS Retic: 41 10*3/uL (ref 19.0–186.0)
RBC.: 3.73 MIL/uL — ABNORMAL LOW (ref 4.22–5.81)
Retic Ct Pct: 1.1 % (ref 0.4–2.3)

## 2015-06-15 LAB — FOLATE: FOLATE: 14.7 ng/mL (ref >5.9)

## 2015-06-15 LAB — MICROALBUMIN / CREATININE URINE RATIO
Creatinine,U: 95.5 mg/dL
MICROALB UR: 4.9 mg/dL — AB (ref 0.0–1.9)
MICROALB/CREAT RATIO: 5.1 mg/g (ref 0.0–30.0)

## 2015-06-15 LAB — VITAMIN B12: VITAMIN B 12: 549 pg/mL (ref 211–911)

## 2015-06-15 LAB — HEMOGLOBIN A1C: HEMOGLOBIN A1C: 6.8 % — AB (ref 4.6–6.5)

## 2015-06-15 MED ORDER — COLCHICINE 0.6 MG PO TABS: 0.6000 mg | ORAL_TABLET | Freq: Two times a day (BID) | ORAL | Status: DC

## 2015-06-15 MED ORDER — TRAMADOL HCL 50 MG PO TABS: 50.0000 mg | ORAL_TABLET | Freq: Every day | ORAL | Status: DC | PRN

## 2015-06-15 NOTE — Patient Instructions (Signed)
Anemia, Nonspecific Anemia is a condition in which the concentration of red blood cells or hemoglobin in the blood is below normal. Hemoglobin is a substance in red blood cells that carries oxygen to the tissues of the body. Anemia results in not enough oxygen reaching these tissues.  CAUSES  Common causes of anemia include:   Excessive bleeding. Bleeding may be internal or external. This includes excessive bleeding from periods (in women) or from the intestine.   Poor nutrition.   Chronic kidney, thyroid, and liver disease.  Bone marrow disorders that decrease red blood cell production.  Cancer and treatments for cancer.  HIV, AIDS, and their treatments.  Spleen problems that increase red blood cell destruction.  Blood disorders.  Excess destruction of red blood cells due to infection, medicines, and autoimmune disorders. SIGNS AND SYMPTOMS   Minor weakness.   Dizziness.   Headache.  Palpitations.   Shortness of breath, especially with exercise.   Paleness.  Cold sensitivity.  Indigestion.  Nausea.  Difficulty sleeping.  Difficulty concentrating. Symptoms may occur suddenly or they may develop slowly.  DIAGNOSIS  Additional blood tests are often needed. These help your health care provider determine the best treatment. Your health care provider will check your stool for blood and look for other causes of blood loss.  TREATMENT  Treatment varies depending on the cause of the anemia. Treatment can include:   Supplements of iron, vitamin B12, or folic acid.   Hormone medicines.   A blood transfusion. This may be needed if blood loss is severe.   Hospitalization. This may be needed if there is significant continual blood loss.   Dietary changes.  Spleen removal. HOME CARE INSTRUCTIONS Keep all follow-up appointments. It often takes many weeks to correct anemia, and having your health care provider check on your condition and your response to  treatment is very important. SEEK IMMEDIATE MEDICAL CARE IF:   You develop extreme weakness, shortness of breath, or chest pain.   You become dizzy or have trouble concentrating.  You develop heavy vaginal bleeding.   You develop a rash.   You have bloody or black, tarry stools.   You faint.   You vomit up blood.   You vomit repeatedly.   You have abdominal pain.  You have a fever or persistent symptoms for more than 2-3 days.   You have a fever and your symptoms suddenly get worse.   You are dehydrated.  MAKE SURE YOU:  Understand these instructions.  Will watch your condition.  Will get help right away if you are not doing well or get worse.   This information is not intended to replace advice given to you by your health care provider. Make sure you discuss any questions you have with your health care provider.   Document Released: 08/03/2004 Document Revised: 02/26/2013 Document Reviewed: 12/20/2012 Elsevier Interactive Patient Education 2016 [employer]  

## 2015-06-15 NOTE — Progress Notes (Signed)
Subjective:  Patient ID: [name], male    DOB: 1925/11/05  Age: 81 y.o. MRN: [id number]  CC: Anemia; Diabetes; and Osteoarthritis   HPI Harold SagoClyde Orser presents for f/up, he complains of arthritis pain in his hands but offers no other complaints.  Outpatient Prescriptions Prior to Visit  Medication Sig Dispense Refill  . allopurinol (ZYLOPRIM) 100 MG tablet Take 1 tablet (100 mg total) by mouth daily. 30 tablet 11  . aspirin 81 MG tablet Take 81 mg by mouth daily.      . Cholecalciferol (VITAMIN D) 2000 UNITS CAPS Take 1 capsule (2,000 Units total) by mouth daily. 90 capsule 3  . clotrimazole (LOTRIMIN) 1 % cream Apply 1 application topically 2 (two) times daily. 60 g 2  . dorzolamide-timolol (COSOPT) 22.3-6.8 MG/ML ophthalmic solution Place 1 drop into both eyes 2 (two) times daily.     Marland Kitchen. latanoprost (XALATAN) 0.005 % ophthalmic solution Place 1 drop into both eyes at bedtime.    Marland Kitchen. losartan-hydrochlorothiazide (HYZAAR) 50-12.5 MG per tablet take 1 tablet by mouth once daily 30 tablet 11  . meclizine (ANTIVERT) 25 MG tablet Take 1/2 to 1 tablet by mouth every 4 hours as needed for dizziness 50 tablet 6  . metoprolol succinate (TOPROL-XL) 50 MG 24 hr tablet take 1 tablet by mouth once daily 30 tablet 11  . pilocarpine (PILOCAR) 1 % ophthalmic solution Place 1 drop into the right eye 3 (three) times daily.    . simvastatin (ZOCOR) 40 MG tablet Take 1 tablet (40 mg total) by mouth at bedtime. 30 tablet 5  . colchicine 0.6 MG tablet Take 1 tablet (0.6 mg total) by mouth 2 (two) times daily. 60 tablet 3  . traMADol (ULTRAM) 50 MG tablet Take 1 tablet (50 mg total) by mouth daily as needed. take 1 tablet by mouth every 6 hours if needed 75 tablet 5   No facility-administered medications prior to visit.    ROS Review of Systems  Constitutional: Negative.  Negative for fever, chills, diaphoresis, appetite change and fatigue.  HENT: Negative.  Negative for sinus pressure, sore throat and  trouble swallowing.   Eyes: Negative.   Respiratory: Negative.  Negative for cough, choking, chest tightness, shortness of breath and stridor.   Cardiovascular: Negative.  Negative for chest pain, palpitations and leg swelling.  Gastrointestinal: Negative.  Negative for nausea, vomiting, abdominal pain, diarrhea, constipation and blood in stool.  Endocrine: Negative.   Genitourinary: Negative.   Musculoskeletal: Positive for back pain and arthralgias. Negative for myalgias, joint swelling and neck pain.  Skin: Negative.  Negative for color change, pallor and rash.  Allergic/Immunologic: Negative.   Neurological: Negative.  Negative for dizziness, tremors, syncope, light-headedness, numbness and headaches.  Hematological: Negative.  Negative for adenopathy. Does not bruise/bleed easily.  Psychiatric/Behavioral: Negative.     Objective:  BP 134/70 mmHg  Pulse 56  Temp(Src) 97.8 F (36.6 C) (Oral)  Ht 5\' 6"  (1.676 m)  Wt 204 lb (92.534 kg)  BMI 32.94 kg/m2  SpO2 96%  BP Readings from Last 3 Encounters:  06/15/15 134/70  12/14/14 138/72  06/12/14 120/70    Wt Readings from Last 3 Encounters:  06/15/15 204 lb (92.534 kg)  12/14/14 202 lb (91.627 kg)  06/12/14 200 lb (90.719 kg)    Physical Exam  Constitutional: He is oriented to person, place, and time. No distress.  HENT:  Head: Normocephalic and atraumatic.  Mouth/Throat: Oropharynx is clear and moist. No oropharyngeal exudate.  Eyes: Conjunctivae are  normal. Right eye exhibits no discharge. Left eye exhibits no discharge. No scleral icterus.  Neck: Normal range of motion. Neck supple. No JVD present. No tracheal deviation present. No thyromegaly present.  Cardiovascular: Normal rate, regular rhythm, normal heart sounds and intact distal pulses.  Exam reveals no gallop and no friction rub.   No murmur heard. Pulmonary/Chest: Effort normal and breath sounds normal. No stridor. No respiratory distress. He has no wheezes. He  has no rales. He exhibits no tenderness.  Abdominal: Soft. Bowel sounds are normal. He exhibits no distension and no mass. There is no tenderness. There is no rebound and no guarding.  Musculoskeletal: Normal range of motion. He exhibits no edema or tenderness.  Lymphadenopathy:    He has no cervical adenopathy.  Neurological: He is oriented to person, place, and time.  Skin: Skin is warm and dry. No rash noted. He is not diaphoretic. No erythema. No pallor.  Psychiatric: He has a normal mood and affect. His behavior is normal. Judgment and thought content normal.  Vitals reviewed.   Lab Results  Component Value Date   WBC 4.6 06/15/2015   HGB 12.6* 06/15/2015   HCT 38.0* 06/15/2015   PLT 169.0 06/15/2015   GLUCOSE 126* 06/15/2015   CHOL 110 12/14/2014   TRIG 83.0 12/14/2014   HDL 42.80 12/14/2014   LDLCALC 51 12/14/2014   ALT 43 12/14/2014   AST 32 12/14/2014   NA 138 06/15/2015   K 3.9 06/15/2015   CL 104 06/15/2015   CREATININE 1.32 06/15/2015   BUN 25* 06/15/2015   CO2 27 06/15/2015   TSH 2.94 12/14/2014   PSA 0.46 02/16/2011   HGBA1C 6.8* 06/15/2015   MICROALBUR 4.9* 06/15/2015    Dg Chest 2 View  02/27/2013  *RADIOLOGY REPORT* Clinical Data: History of pipe smoking.  History of hypertension. No chest complaints. CHEST - 2 VIEW Comparison: 02/23/2012. Findings: There is mild cardiac silhouette enlargement. Ectasia and nonaneurysmal calcification of the thoracic aorta are seen. Mediastinal and hilar contours appear stable. No pleural abnormality is evident.  No active pulmonary infiltrates are seen. No pulmonary masses are evident.  There is narrowing of some of the intervertebral disc spaces.  Multilevel marginal osteophyte formation is seen representing degenerative spondylosis. IMPRESSION: Mild cardiac silhouette enlargement which is slightly larger than on prior study.  No acute or active pulmonary or pleural abnormalities are seen.  Chronic bony findings are described  above. Original Report Authenticated By: Onalee Hua Call    Assessment & Plan:   Jian was seen today for anemia, diabetes and osteoarthritis.  Diagnoses and all orders for this visit:  Midline low back pain without sciatica -     traMADol (ULTRAM) 50 MG tablet; Take 1 tablet (50 mg total) by mouth daily as needed. take 1 tablet by mouth every 6 hours if needed  Primary osteoarthritis involving multiple joints -     traMADol (ULTRAM) 50 MG tablet; Take 1 tablet (50 mg total) by mouth daily as needed. take 1 tablet by mouth every 6 hours if needed  Idiopathic gout, unspecified chronicity, unspecified site -     traMADol (ULTRAM) 50 MG tablet; Take 1 tablet (50 mg total) by mouth daily as needed. take 1 tablet by mouth every 6 hours if needed  Idiopathic gout of hand, unspecified chronicity, unspecified laterality -     colchicine 0.6 MG tablet; Take 1 tablet (0.6 mg total) by mouth 2 (two) times daily.  Type 2 diabetes mellitus with complication, without long-term  current use of insulin (HCC)- his A1c is up to 6.8%. This is an increase but he still does not require medical therapy for this. We'll continue to work on his lifestyle modifications. -     Basic metabolic panel; Future -     Hemoglobin A1c; Future -     Microalbumin / creatinine urine ratio; Future  Essential hypertension- his blood pressure is well-controlled, lites and renal function are stable. -     CBC with Differential/Platelet; Future -     Basic metabolic panel; Future -     Urinalysis, Routine w reflex microscopic (not at [facility]); Future  Hyperlipidemia with target LDL less than 100- he is achieved his LDL goal and does not want to start a statin.  Pernicious anemia- this appears to be the anemia of chronic disease, will check his vitamin levels today to screen for vitamin deficiency. He is asymptomatic related to this so no treatment will be offered at this time. -     IBC panel; Future -     Reticulocytes; Future -      Ferritin; Future -     Folate; Future -     Methylmalonic acid, serum; Future -     Vitamin B12; Future   I am having Mr. Highley maintain his aspirin, dorzolamide-timolol, latanoprost, meclizine, pilocarpine, losartan-hydrochlorothiazide, metoprolol succinate, clotrimazole, Vitamin D, allopurinol, simvastatin, brimonidine, traMADol, and colchicine.  Meds ordered this encounter  Medications  . brimonidine (ALPHAGAN) 0.2 % ophthalmic solution    Sig: 1 drop.  . traMADol (ULTRAM) 50 MG tablet    Sig: Take 1 tablet (50 mg total) by mouth daily as needed. take 1 tablet by mouth every 6 hours if needed    Dispense:  75 tablet    Refill:  5  . colchicine 0.6 MG tablet    Sig: Take 1 tablet (0.6 mg total) by mouth 2 (two) times daily.    Dispense:  60 tablet    Refill:  3     Follow-up: Return in about 6 months (around 12/14/2015).  Sanda Linger, MD

## 2015-06-15 NOTE — Progress Notes (Signed)
Pre visit review using our clinic review tool, if applicable. No additional management support is needed unless otherwise documented below in the visit note. 

## 2015-06-18 LAB — METHYLMALONIC ACID, SERUM: METHYLMALONIC ACID, QUANT: 80 nmol/L — AB (ref 87–318)

## 2015-06-19 ENCOUNTER — Encounter: Payer: Self-pay | Admitting: Internal Medicine

## 2015-09-10 ENCOUNTER — Telehealth: Payer: Self-pay | Admitting: Internal Medicine

## 2015-09-10 MED ORDER — LOSARTAN POTASSIUM-HCTZ 50-12.5 MG PO TABS: 1.0000 | ORAL_TABLET | Freq: Every day | ORAL | Status: DC

## 2015-09-10 MED ORDER — METOPROLOL SUCCINATE ER 50 MG PO TB24: 50.0000 mg | ORAL_TABLET | Freq: Every day | ORAL | Status: DC

## 2015-09-10 NOTE — Telephone Encounter (Signed)
Pt requesting refill for metoprolol succinate (TOPROL-XL) 50 MG 24 hr tablet [[id number]] and losartan-hydrochlorothiazi[[id number]]de (HYZAAR) 50-12.5 MG per tablet [[id number]][[id number]]  Pharmacy is [facility] on [address].  Can you please give him a call when they have been sent

## 2015-09-10 NOTE — Telephone Encounter (Signed)
Notified pt refills has been sent.../lmb 

## 2015-09-18 DIAGNOSIS — H401133 Primary open-angle glaucoma, bilateral, severe stage: Secondary | ICD-10-CM | POA: Diagnosis not present

## 2015-10-18 ENCOUNTER — Telehealth: Payer: Self-pay | Admitting: Internal Medicine

## 2015-10-18 DIAGNOSIS — M10049 Idiopathic gout, unspecified hand: Secondary | ICD-10-CM

## 2015-10-18 MED ORDER — COLCHICINE 0.6 MG PO TABS: 0.6000 mg | ORAL_TABLET | Freq: Two times a day (BID) | ORAL | Status: DC

## 2015-10-18 NOTE — Addendum Note (Signed)
Addended by: Anselm JunglingBONYUN-DEBOURGH, Mizraim Harmening M on: 10/18/2015 03:27 PM   Modules accepted: Orders

## 2015-10-18 NOTE — Telephone Encounter (Signed)
Refills should be at the pharmacy

## 2015-10-18 NOTE — Telephone Encounter (Signed)
Pt called back, last refill was in March 2017.  Rx refill sent in per pt request

## 2015-10-18 NOTE — Telephone Encounter (Signed)
Pt requesting refill for colchicine 0.6 MG tablet [[id number] Pharmacy is [facility] on [address]

## 2015-11-30 ENCOUNTER — Other Ambulatory Visit: Payer: Self-pay | Admitting: *Deleted

## 2015-11-30 MED ORDER — SIMVASTATIN 40 MG PO TABS: 40.0000 mg | ORAL_TABLET | Freq: Every day | ORAL | Status: DC

## 2015-11-30 NOTE — Telephone Encounter (Signed)
Receive call pt states pharmacy has been trying to get his simvastatin refill, but they have not receive call bck. Verified pharmacy inform pt will send refill to [facility]...Raechel Chute/lmb

## 2015-12-15 ENCOUNTER — Ambulatory Visit (INDEPENDENT_AMBULATORY_CARE_PROVIDER_SITE_OTHER): Payer: Medicare Other | Admitting: Internal Medicine

## 2015-12-15 ENCOUNTER — Other Ambulatory Visit (INDEPENDENT_AMBULATORY_CARE_PROVIDER_SITE_OTHER): Payer: Medicare Other

## 2015-12-15 ENCOUNTER — Encounter: Payer: Self-pay | Admitting: Internal Medicine

## 2015-12-15 VITALS — BP 130/78 | HR 61 | Temp 98.2°F | Resp 16 | Ht 66.0 in | Wt 198.0 lb

## 2015-12-15 DIAGNOSIS — M1000 Idiopathic gout, unspecified site: Secondary | ICD-10-CM

## 2015-12-15 DIAGNOSIS — I10 Essential (primary) hypertension: Secondary | ICD-10-CM | POA: Diagnosis not present

## 2015-12-15 DIAGNOSIS — Z0000 Encounter for general adult medical examination without abnormal findings: Secondary | ICD-10-CM

## 2015-12-15 DIAGNOSIS — E118 Type 2 diabetes mellitus with unspecified complications: Secondary | ICD-10-CM

## 2015-12-15 DIAGNOSIS — E785 Hyperlipidemia, unspecified: Secondary | ICD-10-CM

## 2015-12-15 DIAGNOSIS — M545 Low back pain, unspecified: Secondary | ICD-10-CM

## 2015-12-15 DIAGNOSIS — M10049 Idiopathic gout, unspecified hand: Secondary | ICD-10-CM

## 2015-12-15 DIAGNOSIS — D539 Nutritional anemia, unspecified: Secondary | ICD-10-CM | POA: Diagnosis not present

## 2015-12-15 DIAGNOSIS — M159 Polyosteoarthritis, unspecified: Secondary | ICD-10-CM

## 2015-12-15 DIAGNOSIS — M150 Primary generalized (osteo)arthritis: Secondary | ICD-10-CM

## 2015-12-15 LAB — URIC ACID: URIC ACID, SERUM: 5.1 mg/dL (ref 4.0–7.8)

## 2015-12-15 LAB — LIPID PANEL
Cholesterol: 113 mg/dL (ref 0–200)
HDL: 44.6 mg/dL (ref >39.00)
LDL Cholesterol: 52 mg/dL (ref 0–99)
NonHDL: 67.91
TRIGLYCERIDES: 79 mg/dL (ref 0.0–149.0)
Total CHOL/HDL Ratio: 3
VLDL: 15.8 mg/dL (ref 0.0–40.0)

## 2015-12-15 LAB — CBC WITH DIFFERENTIAL/PLATELET
BASOS PCT: 0.4 % (ref 0.0–3.0)
Basophils Absolute: 0 10*3/uL (ref 0.0–0.1)
EOS PCT: 14.3 % — AB (ref 0.0–5.0)
Eosinophils Absolute: 0.7 10*3/uL (ref 0.0–0.7)
HCT: 37.2 % — ABNORMAL LOW (ref 39.0–52.0)
Hemoglobin: 12.7 g/dL — ABNORMAL LOW (ref 13.0–17.0)
LYMPHS ABS: 1.3 10*3/uL (ref 0.7–4.0)
Lymphocytes Relative: 25.1 % (ref 12.0–46.0)
MCHC: 34 g/dL (ref 30.0–36.0)
MCV: 99.6 fl (ref 78.0–100.0)
MONO ABS: 0.8 10*3/uL (ref 0.1–1.0)
Monocytes Relative: 14.6 % — ABNORMAL HIGH (ref 3.0–12.0)
NEUTROS ABS: 2.4 10*3/uL (ref 1.4–7.7)
NEUTROS PCT: 45.6 % (ref 43.0–77.0)
PLATELETS: 177 10*3/uL (ref 150.0–400.0)
RBC: 3.74 Mil/uL — ABNORMAL LOW (ref 4.22–5.81)
RDW: 14.6 % (ref 11.5–15.5)
WBC: 5.2 10*3/uL (ref 4.0–10.5)

## 2015-12-15 LAB — BASIC METABOLIC PANEL
BUN: 22 mg/dL (ref 6–23)
CALCIUM: 9.6 mg/dL (ref 8.4–10.5)
CO2: 28 mEq/L (ref 19–32)
CREATININE: 1.09 mg/dL (ref 0.40–1.50)
Chloride: 100 mEq/L (ref 96–112)
GFR: 82.1 mL/min (ref >60.00)
Glucose, Bld: 110 mg/dL — ABNORMAL HIGH (ref 70–99)
Potassium: 3.8 mEq/L (ref 3.5–5.1)
Sodium: 134 mEq/L — ABNORMAL LOW (ref 135–145)

## 2015-12-15 LAB — FERRITIN: Ferritin: 342.2 ng/mL — ABNORMAL HIGH (ref 22.0–322.0)

## 2015-12-15 LAB — HEMOGLOBIN A1C: HEMOGLOBIN A1C: 6.5 % (ref 4.6–6.5)

## 2015-12-15 LAB — FOLATE: Folate: 13.3 ng/mL (ref >5.9)

## 2015-12-15 LAB — IBC PANEL
Iron: 98 ug/dL (ref 42–165)
Saturation Ratios: 28.8 % (ref 20.0–50.0)
Transferrin: 243 mg/dL (ref 212.0–360.0)

## 2015-12-15 LAB — TSH: TSH: 2.37 u[IU]/mL (ref 0.35–4.50)

## 2015-12-15 LAB — VITAMIN B12: Vitamin B-12: 599 pg/mL (ref 211–911)

## 2015-12-15 MED ORDER — METOPROLOL SUCCINATE ER 50 MG PO TB24: 50.0000 mg | ORAL_TABLET | Freq: Every day | ORAL | Status: DC

## 2015-12-15 MED ORDER — TRAMADOL HCL 50 MG PO TABS: 50.0000 mg | ORAL_TABLET | Freq: Every day | ORAL | Status: DC | PRN

## 2015-12-15 MED ORDER — COLCHICINE 0.6 MG PO TABS: 0.6000 mg | ORAL_TABLET | Freq: Two times a day (BID) | ORAL | Status: DC

## 2015-12-15 NOTE — Patient Instructions (Signed)

## 2015-12-15 NOTE — Progress Notes (Signed)
Subjective:  Patient ID: [name], male    DOB: 01-20-1926  Age: 82 y.o. MRN: [id number]  CC: Hypertension; Hyperlipidemia; Diabetes; Anemia; Osteoarthritis; and Annual Exam   HPI [name] presents for a CPX.  He is due for follow-up on the above medical problems. He tells me that his blood sugar has been well controlled. He has had no recent episodes of polyuria/polydipsia/polyphagia. His blood pressure has been well controlled with the combination of losartan, hydrochlorothiazide, and metoprolol. He denies headache/blurred vision/chest pain/shortness of breath/palpitations/edema/or fatigue. He is tolerating simvastatin with no muscle aches. He does complain of chronic, unchanged low back pain that does not radiate into his lower extremities./      Past Medical History  Diagnosis Date  . Unspecified glaucoma   . Tobacco use disorder   . Unspecified essential hypertension   . Right bundle branch block   . Pure hypercholesterolemia   . Overweight(278.02)   . Esophageal reflux   . Osteoarthrosis, unspecified whether generalized or localized, unspecified site   . Lumbago   . Abnormality of gait   . Angioneurotic edema not elsewhere classified    Past Surgical History  Procedure Laterality Date  . Cataract surgery  2004/2005    Dr. Cecilie Kicks    reports that he has been smoking Pipe.  He does not have any smokeless tobacco history on file. He reports that he does not drink alcohol or use illicit drugs. family history includes Heart disease in his father. Allergies  Allergen Reactions  . Iodine     REACTION: rash---whleps  . Dipivefrin Rash  . Methazolamide Rash  . Metoclopramide Rash    Hives    Outpatient Prescriptions Prior to Visit  Medication Sig Dispense Refill  . allopurinol (ZYLOPRIM) 100 MG tablet Take 1 tablet (100 mg total) by mouth daily. 30 tablet 11  . aspirin 81 MG tablet Take 81 mg by mouth daily.      . brimonidine (ALPHAGAN) 0.2 % ophthalmic  solution 1 drop.    . Cholecalciferol (VITAMIN D) 2000 UNITS CAPS Take 1 capsule (2,000 Units total) by mouth daily. 90 capsule 3  . clotrimazole (LOTRIMIN) 1 % cream Apply 1 application topically 2 (two) times daily. 60 g 2  . dorzolamide-timolol (COSOPT) 22.3-6.8 MG/ML ophthalmic solution Place 1 drop into both eyes 2 (two) times daily.     Marland Kitchen latanoprost (XALATAN) 0.005 % ophthalmic solution Place 1 drop into both eyes at bedtime.    Marland Kitchen losartan-hydrochlorothiazide (HYZAAR) 50-12.5 MG tablet Take 1 tablet by mouth daily. 30 tablet 5  . meclizine (ANTIVERT) 25 MG tablet Take 1/2 to 1 tablet by mouth every 4 hours as needed for dizziness 50 tablet 6  . pilocarpine (PILOCAR) 1 % ophthalmic solution Place 1 drop into the right eye 3 (three) times daily.    . simvastatin (ZOCOR) 40 MG tablet Take 1 tablet (40 mg total) by mouth at bedtime. 90 tablet 1  . colchicine 0.6 MG tablet Take 1 tablet (0.6 mg total) by mouth 2 (two) times daily. 60 tablet 1  . metoprolol succinate (TOPROL-XL) 50 MG 24 hr tablet Take 1 tablet (50 mg total) by mouth daily. Take with or immediately following a meal. 30 tablet 5  . traMADol (ULTRAM) 50 MG tablet Take 1 tablet (50 mg total) by mouth daily as needed. take 1 tablet by mouth every 6 hours if needed 75 tablet 5   No facility-administered medications prior to visit.    ROS Review of Systems  Constitutional: Negative.  Negative for fever, chills, diaphoresis, appetite change and fatigue.  HENT: Negative.   Eyes: Negative.  Negative for visual disturbance.  Respiratory: Negative.  Negative for cough, choking, chest tightness, shortness of breath and stridor.   Cardiovascular: Negative.  Negative for chest pain, palpitations and leg swelling.  Gastrointestinal: Negative.  Negative for nausea, vomiting, abdominal pain, diarrhea, constipation and blood in stool.  Endocrine: Negative.   Genitourinary: Negative.   Musculoskeletal: Positive for back pain and arthralgias.  Negative for myalgias, joint swelling and neck pain.       He complains of bilateral wrist and hand pain. He has not no episodes of red or swollen joints. He gets symptom relief with tramadol as needed.  Skin: Negative.  Negative for color change and rash.  Allergic/Immunologic: Negative.   Neurological: Negative.  Negative for dizziness, syncope, speech difficulty, weakness, light-headedness and numbness.  Hematological: Negative.  Negative for adenopathy. Does not bruise/bleed easily.  Psychiatric/Behavioral: Negative.     Objective:  BP 130/78 mmHg  Pulse 61  Temp(Src) 98.2 F (36.8 C) (Oral)  Resp 16  Ht 5\' 6"  (1.676 m)  Wt 198 lb (89.812 kg)  BMI 31.97 kg/m2  SpO2 98%  BP Readings from Last 3 Encounters:  12/15/15 130/78  06/15/15 134/70  12/14/14 138/72    Wt Readings from Last 3 Encounters:  12/15/15 198 lb (89.812 kg)  06/15/15 204 lb (92.534 kg)  12/14/14 202 lb (91.627 kg)    Physical Exam  Constitutional: He is oriented to person, place, and time. He appears well-developed and well-nourished. No distress.  HENT:  Head: Normocephalic and atraumatic.  Mouth/Throat: Oropharynx is clear and moist. No oropharyngeal exudate.  Eyes: Conjunctivae are normal. Right eye exhibits no discharge. Left eye exhibits no discharge. No scleral icterus.  Neck: Normal range of motion. Neck supple. No JVD present. No tracheal deviation present. No thyromegaly present.  Cardiovascular: Normal rate, regular rhythm, normal heart sounds and intact distal pulses.  Exam reveals no gallop and no friction rub.   No murmur heard. Pulmonary/Chest: Effort normal and breath sounds normal. No stridor. No respiratory distress. He has no wheezes. He has no rales. He exhibits no tenderness.  Abdominal: Soft. Bowel sounds are normal. He exhibits no distension and no mass. There is no tenderness. There is no rebound and no guarding.  Musculoskeletal: Normal range of motion. He exhibits no edema or  tenderness.  Lymphadenopathy:    He has no cervical adenopathy.  Neurological: He is oriented to person, place, and time.  Skin: Skin is warm and dry. No rash noted. He is not diaphoretic. No erythema. No pallor.  Psychiatric: He has a normal mood and affect. His behavior is normal. Judgment and thought content normal.  Vitals reviewed.   Lab Results  Component Value Date   WBC 5.2 12/15/2015   HGB 12.7* 12/15/2015   HCT 37.2* 12/15/2015   PLT 177.0 12/15/2015   GLUCOSE 110* 12/15/2015   CHOL 113 12/15/2015   TRIG 79.0 12/15/2015   HDL 44.60 12/15/2015   LDLCALC 52 12/15/2015   ALT 43 12/14/2014   AST 32 12/14/2014   NA 134* 12/15/2015   K 3.8 12/15/2015   CL 100 12/15/2015   CREATININE 1.09 12/15/2015   BUN 22 12/15/2015   CO2 28 12/15/2015   TSH 2.37 12/15/2015   PSA 0.46 02/16/2011   HGBA1C 6.5 12/15/2015   MICROALBUR 4.9* 06/15/2015    Dg Chest 2 View  02/27/2013  *RADIOLOGY REPORT* Clinical  Data: History of pipe smoking.  History of hypertension. No chest complaints. CHEST - 2 VIEW Comparison: 02/23/2012. Findings: There is mild cardiac silhouette enlargement. Ectasia and nonaneurysmal calcification of the thoracic aorta are seen. Mediastinal and hilar contours appear stable. No pleural abnormality is evident.  No active pulmonary infiltrates are seen. No pulmonary masses are evident.  There is narrowing of some of the intervertebral disc spaces.  Multilevel marginal osteophyte formation is seen representing degenerative spondylosis. IMPRESSION: Mild cardiac silhouette enlargement which is slightly larger than on prior study.  No acute or active pulmonary or pleural abnormalities are seen.  Chronic bony findings are described above. Original Report Authenticated By: Onalee Hua Call    Assessment & Plan:   [name] was seen today for hypertension, hyperlipidemia, diabetes, anemia, osteoarthritis and annual exam.  Diagnoses and all orders for this visit:  Essential  hypertension- his blood pressure is well-controlled, electrolytes and renal function are stable. -     Basic metabolic panel; Future -     TSH; Future -     metoprolol succinate (TOPROL-XL) 50 MG 24 hr tablet; Take 1 tablet (50 mg total) by mouth daily. Take with or immediately following a meal.  Type 2 diabetes mellitus with complication, without long-term current use of insulin (HCC)- his blood sugars are well-controlled, no medications are needed at this time. -     Basic metabolic panel; Future -     Hemoglobin A1c; Future  Deficiency anemia- his hemoglobin and hematocrit are stable, all of his vitamin levels are normal, his appears to be the anemia of aging and chronic disease. -     CBC with Differential/Platelet; Future -     IBC panel; Future -     Vitamin B12; Future -     Ferritin; Future -     Folate; Future  Idiopathic gout, unspecified chronicity, unspecified site- his gout is well-controlled -     Uric acid; Future -     traMADol (ULTRAM) 50 MG tablet; Take 1 tablet (50 mg total) by mouth daily as needed. take 1 tablet by mouth every 6 hours if needed  Hyperlipidemia with target LDL less than 100- he is achieved his LDL goal is doing well on statin therapy -     TSH; Future -     Lipid panel; Future  Idiopathic gout of hand, unspecified chronicity, unspecified laterality -     colchicine 0.6 MG tablet; Take 1 tablet (0.6 mg total) by mouth 2 (two) times daily.  Midline low back pain without sciatica -     traMADol (ULTRAM) 50 MG tablet; Take 1 tablet (50 mg total) by mouth daily as needed. take 1 tablet by mouth every 6 hours if needed  Primary osteoarthritis involving multiple joints -     traMADol (ULTRAM) 50 MG tablet; Take 1 tablet (50 mg total) by mouth daily as needed. take 1 tablet by mouth every 6 hours if needed   I am having [name] maintain his aspirin, dorzolamide-timolol, latanoprost, meclizine, pilocarpine, clotrimazole, Vitamin D, allopurinol,  brimonidine, losartan-hydrochlorothiazide, simvastatin, colchicine, metoprolol succinate, and traMADol.  Meds ordered this encounter  Medications  . colchicine 0.6 MG tablet    Sig: Take 1 tablet (0.6 mg total) by mouth 2 (two) times daily.    Dispense:  180 tablet    Refill:  3  . metoprolol succinate (TOPROL-XL) 50 MG 24 hr tablet    Sig: Take 1 tablet (50 mg total) by mouth daily. Take with or immediately  following a meal.    Dispense:  90 tablet    Refill:  3  . traMADol (ULTRAM) 50 MG tablet    Sig: Take 1 tablet (50 mg total) by mouth daily as needed. take 1 tablet by mouth every 6 hours if needed    Dispense:  75 tablet    Refill:  5   See AVS for instructions about healthy living and anticipatory guidance.  Follow-up: Return in about 6 months (around 06/15/2016).  Sanda Linger, MD

## 2015-12-15 NOTE — Progress Notes (Signed)
Pre visit review using our clinic review tool, if applicable. No additional management support is needed unless otherwise documented below in the visit note. 

## 2015-12-19 DIAGNOSIS — Z7189 Other specified counseling: Secondary | ICD-10-CM | POA: Insufficient documentation

## 2015-12-19 DIAGNOSIS — M545 Low back pain, unspecified: Secondary | ICD-10-CM | POA: Insufficient documentation

## 2016-01-24 DIAGNOSIS — H401133 Primary open-angle glaucoma, bilateral, severe stage: Secondary | ICD-10-CM | POA: Diagnosis not present

## 2016-02-13 ENCOUNTER — Other Ambulatory Visit: Payer: Self-pay | Admitting: Internal Medicine

## 2016-02-13 DIAGNOSIS — E559 Vitamin D deficiency, unspecified: Secondary | ICD-10-CM

## 2016-03-07 ENCOUNTER — Other Ambulatory Visit: Payer: Self-pay | Admitting: Internal Medicine

## 2016-04-14 ENCOUNTER — Other Ambulatory Visit: Payer: Self-pay | Admitting: Internal Medicine

## 2016-04-14 DIAGNOSIS — M1000 Idiopathic gout, unspecified site: Secondary | ICD-10-CM

## 2016-05-29 ENCOUNTER — Other Ambulatory Visit: Payer: Self-pay | Admitting: Internal Medicine

## 2016-06-15 ENCOUNTER — Other Ambulatory Visit (INDEPENDENT_AMBULATORY_CARE_PROVIDER_SITE_OTHER): Payer: Medicare Other

## 2016-06-15 ENCOUNTER — Ambulatory Visit (INDEPENDENT_AMBULATORY_CARE_PROVIDER_SITE_OTHER): Payer: Medicare Other | Admitting: Internal Medicine

## 2016-06-15 ENCOUNTER — Encounter: Payer: Self-pay | Admitting: Internal Medicine

## 2016-06-15 VITALS — BP 112/70 | HR 66 | Temp 98.0°F | Resp 16 | Ht 66.0 in | Wt 201.8 lb

## 2016-06-15 DIAGNOSIS — E118 Type 2 diabetes mellitus with unspecified complications: Secondary | ICD-10-CM

## 2016-06-15 DIAGNOSIS — I10 Essential (primary) hypertension: Secondary | ICD-10-CM

## 2016-06-15 DIAGNOSIS — M545 Low back pain, unspecified: Secondary | ICD-10-CM

## 2016-06-15 DIAGNOSIS — D539 Nutritional anemia, unspecified: Secondary | ICD-10-CM | POA: Diagnosis not present

## 2016-06-15 DIAGNOSIS — M1000 Idiopathic gout, unspecified site: Secondary | ICD-10-CM

## 2016-06-15 DIAGNOSIS — M159 Polyosteoarthritis, unspecified: Secondary | ICD-10-CM

## 2016-06-15 DIAGNOSIS — G8929 Other chronic pain: Secondary | ICD-10-CM

## 2016-06-15 DIAGNOSIS — M150 Primary generalized (osteo)arthritis: Secondary | ICD-10-CM

## 2016-06-15 LAB — URIC ACID: URIC ACID, SERUM: 4.9 mg/dL (ref 4.0–7.8)

## 2016-06-15 LAB — CBC WITH DIFFERENTIAL/PLATELET
BASOS ABS: 0 10*3/uL (ref 0.0–0.1)
Basophils Relative: 0.3 % (ref 0.0–3.0)
EOS ABS: 0.4 10*3/uL (ref 0.0–0.7)
Eosinophils Relative: 8.6 % — ABNORMAL HIGH (ref 0.0–5.0)
HEMATOCRIT: 36.4 % — AB (ref 39.0–52.0)
HEMOGLOBIN: 12.4 g/dL — AB (ref 13.0–17.0)
LYMPHS PCT: 27 % (ref 12.0–46.0)
Lymphs Abs: 1.3 10*3/uL (ref 0.7–4.0)
MCHC: 34.2 g/dL (ref 30.0–36.0)
MCV: 101.5 fl — ABNORMAL HIGH (ref 78.0–100.0)
Monocytes Absolute: 0.7 10*3/uL (ref 0.1–1.0)
Monocytes Relative: 15.2 % — ABNORMAL HIGH (ref 3.0–12.0)
NEUTROS ABS: 2.4 10*3/uL (ref 1.4–7.7)
Neutrophils Relative %: 48.9 % (ref 43.0–77.0)
PLATELETS: 147 10*3/uL — AB (ref 150.0–400.0)
RBC: 3.59 Mil/uL — ABNORMAL LOW (ref 4.22–5.81)
RDW: 14.2 % (ref 11.5–15.5)
WBC: 4.9 10*3/uL (ref 4.0–10.5)

## 2016-06-15 LAB — HEMOGLOBIN A1C: HEMOGLOBIN A1C: 6.7 % — AB (ref 4.6–6.5)

## 2016-06-15 LAB — MICROALBUMIN / CREATININE URINE RATIO
CREATININE, U: 108 mg/dL
MICROALB UR: 3.8 mg/dL — AB (ref 0.0–1.9)
MICROALB/CREAT RATIO: 3.5 mg/g (ref 0.0–30.0)

## 2016-06-15 LAB — BASIC METABOLIC PANEL WITH GFR
BUN: 19 mg/dL (ref 6–23)
CO2: 27 meq/L (ref 19–32)
Calcium: 9.5 mg/dL (ref 8.4–10.5)
Chloride: 102 meq/L (ref 96–112)
Creatinine, Ser: 1.05 mg/dL (ref 0.40–1.50)
GFR: 85.62 mL/min
Glucose, Bld: 107 mg/dL — ABNORMAL HIGH (ref 70–99)
Potassium: 4 meq/L (ref 3.5–5.1)
Sodium: 134 meq/L — ABNORMAL LOW (ref 135–145)

## 2016-06-15 MED ORDER — TRAMADOL HCL 50 MG PO TABS: 50.0000 mg | ORAL_TABLET | Freq: Every day | ORAL | 5 refills | Status: DC | PRN

## 2016-06-15 NOTE — Patient Instructions (Signed)

## 2016-06-15 NOTE — Progress Notes (Signed)
Subjective:  Patient ID: [name], male    DOB: 1926-03-05  Age: 82 y.o. MRN: [id number]  CC: Diabetes; Osteoarthritis; and Hypertension   HPI [name] presents for follow-up: he has had a few joint aches recently but no episodes of joint swelling. His BP has been well controlled.  Outpatient Medications Prior to Visit  Medication Sig Dispense Refill  . allopurinol (ZYLOPRIM) 100 MG tablet Take 1 tablet (100 mg total) by mouth daily. 90 tablet 3  . aspirin 81 MG tablet Take 81 mg by mouth daily.      . clotrimazole (LOTRIMIN) 1 % cream Apply 1 application topically 2 (two) times daily. 60 g 2  . colchicine 0.6 MG tablet Take 1 tablet (0.6 mg total) by mouth 2 (two) times daily. 180 tablet 3  . dorzolamide-timolol (COSOPT) 22.3-6.8 MG/ML ophthalmic solution Place 1 drop into both eyes 2 (two) times daily.     Marland Kitchen. latanoprost (XALATAN) 0.005 % ophthalmic solution Place 1 drop into both eyes at bedtime.    Marland Kitchen. losartan-hydrochlorothiazide (HYZAAR) 50-12.5 MG tablet take 1 tablet by mouth once daily 90 tablet 3  . meclizine (ANTIVERT) 25 MG tablet Take 1/2 to 1 tablet by mouth every 4 hours as needed for dizziness 50 tablet 6  . metoprolol succinate (TOPROL-XL) 50 MG 24 hr tablet Take 1 tablet (50 mg total) by mouth daily. Take with or immediately following a meal. 90 tablet 3  . pilocarpine (PILOCAR) 1 % ophthalmic solution Place 1 drop into the right eye 3 (three) times daily.    Marland Kitchen. RA VITAMIN D-3 2000 units CAPS take 1 capsule by mouth once daily 90 capsule 3  . simvastatin (ZOCOR) 40 MG tablet take 1 tablet by mouth at bedtime 90 tablet 1  . traMADol (ULTRAM) 50 MG tablet Take 1 tablet (50 mg total) by mouth daily as needed. take 1 tablet by mouth every 6 hours if needed 75 tablet 5   No facility-administered medications prior to visit.     ROS Review of Systems  Constitutional: Negative for activity change, appetite change, chills, diaphoresis, fatigue and fever.  HENT:  Negative.  Negative for trouble swallowing.   Eyes: Negative for visual disturbance.  Respiratory: Negative for cough, chest tightness, shortness of breath and stridor.   Cardiovascular: Negative.  Negative for chest pain, palpitations and leg swelling.  Gastrointestinal: Negative.  Negative for abdominal pain, constipation, diarrhea, nausea and vomiting.  Endocrine: Negative.  Negative for cold intolerance, heat intolerance, polydipsia, polyphagia and polyuria.  Genitourinary: Negative.  Negative for difficulty urinating, dysuria and flank pain.  Musculoskeletal: Positive for arthralgias and back pain. Negative for joint swelling and myalgias.  Skin: Negative.   Allergic/Immunologic: Negative.   Neurological: Negative.  Negative for dizziness, weakness, light-headedness and numbness.  Hematological: Negative.  Negative for adenopathy. Does not bruise/bleed easily.  Psychiatric/Behavioral: Negative.     Objective:  BP 112/70 (BP Location: Left Arm, Patient Position: Sitting, Cuff Size: Normal)   Pulse 66   Temp 98 F (36.7 C) (Oral)   Resp 16   Ht 5\' 6"  (1.676 m)   Wt 201 lb 12 oz (91.5 kg)   SpO2 97%   BMI 32.56 kg/m   BP Readings from Last 3 Encounters:  06/15/16 112/70  12/15/15 130/78  06/15/15 134/70    Wt Readings from Last 3 Encounters:  06/15/16 201 lb 12 oz (91.5 kg)  12/15/15 198 lb (89.8 kg)  06/15/15 204 lb (92.5 kg)    Physical Exam  Constitutional: He is oriented to person, place, and time. He appears well-developed and well-nourished. No distress.  HENT:  Mouth/Throat: Oropharynx is clear and moist. No oropharyngeal exudate.  Eyes: Conjunctivae are normal. Right eye exhibits no discharge. Left eye exhibits no discharge. No scleral icterus.  Neck: Normal range of motion. Neck supple. No JVD present. No tracheal deviation present. No thyromegaly present.  Cardiovascular: Normal rate, regular rhythm, normal heart sounds and intact distal pulses.  Exam reveals  no gallop.   No murmur heard. Pulmonary/Chest: Effort normal and breath sounds normal. No stridor. No respiratory distress. He has no wheezes. He has no rales. He exhibits no tenderness.  Abdominal: Soft. Bowel sounds are normal. He exhibits no distension and no mass. There is no tenderness. There is no rebound and no guarding.  Musculoskeletal: Normal range of motion. He exhibits no edema, tenderness or deformity.  Lymphadenopathy:    He has no cervical adenopathy.  Neurological: He is oriented to person, place, and time.  Skin: Skin is warm and dry. No rash noted. He is not diaphoretic. No erythema. No pallor.  Vitals reviewed.   Lab Results  Component Value Date   WBC 4.9 06/15/2016   HGB 12.4 (L) 06/15/2016   HCT 36.4 (L) 06/15/2016   PLT 147.0 (L) 06/15/2016   GLUCOSE 107 (H) 06/15/2016   CHOL 113 12/15/2015   TRIG 79.0 12/15/2015   HDL 44.60 12/15/2015   LDLCALC 52 12/15/2015   ALT 43 12/14/2014   AST 32 12/14/2014   NA 134 (L) 06/15/2016   K 4.0 06/15/2016   CL 102 06/15/2016   CREATININE 1.05 06/15/2016   BUN 19 06/15/2016   CO2 27 06/15/2016   TSH 2.37 12/15/2015   PSA 0.46 02/16/2011   HGBA1C 6.7 (H) 06/15/2016   MICROALBUR 3.8 (H) 06/15/2016    Dg Chest 2 View  Result Date: 02/27/2013 *RADIOLOGY REPORT* Clinical Data: History of pipe smoking.  History of hypertension. No chest complaints. CHEST - 2 VIEW Comparison: 02/23/2012. Findings: There is mild cardiac silhouette enlargement. Ectasia and nonaneurysmal calcification of the thoracic aorta are seen. Mediastinal and hilar contours appear stable. No pleural abnormality is evident.  No active pulmonary infiltrates are seen. No pulmonary masses are evident.  There is narrowing of some of the intervertebral disc spaces.  Multilevel marginal osteophyte formation is seen representing degenerative spondylosis. IMPRESSION: Mild cardiac silhouette enlargement which is slightly larger than on prior study.  No acute or active  pulmonary or pleural abnormalities are seen.  Chronic bony findings are described above. Original Report Authenticated By: Onalee Huaavid Call    Assessment & Plan:   Genevie CheshireClyde was seen today for diabetes, osteoarthritis and hypertension.  Diagnoses and all orders for this visit:  Essential hypertension- His blood pressure is well-controlled, electrolytes and renal function are stable. -     Basic metabolic panel; Future  Type 2 diabetes mellitus with complication, without long-term current use of insulin (HCC)- his A1c is 6.7%, his blood sugars are adequately well controlled. -     Basic metabolic panel; Future -     Microalbumin / creatinine urine ratio; Future -     Hemoglobin A1c; Future  Deficiency anemia- his H&H are stable and he reports no signs of blood loss, this is probably the anemia of chronic disease. -     CBC with Differential/Platelet; Future  Idiopathic gout, unspecified chronicity, unspecified site -     Uric acid; Future -     traMADol (ULTRAM) 50 MG tablet;  Take 1 tablet (50 mg total) by mouth daily as needed. take 1 tablet by mouth every 6 hours if needed  Primary osteoarthritis involving multiple joints -     traMADol (ULTRAM) 50 MG tablet; Take 1 tablet (50 mg total) by mouth daily as needed. take 1 tablet by mouth every 6 hours if needed  Chronic midline low back pain without sciatica -     traMADol (ULTRAM) 50 MG tablet; Take 1 tablet (50 mg total) by mouth daily as needed. take 1 tablet by mouth every 6 hours if needed   I am having Mr. Hohn maintain his aspirin, dorzolamide-timolol, latanoprost, meclizine, pilocarpine, clotrimazole, colchicine, metoprolol succinate, RA VITAMIN D-3, losartan-hydrochlorothiazide, allopurinol, simvastatin, brimonidine, and traMADol.  Meds ordered this encounter  Medications  . brimonidine (ALPHAGAN) 0.2 % ophthalmic solution    Sig: Place 1 drop into the right eye 3 times daily.  . traMADol (ULTRAM) 50 MG tablet    Sig: Take 1  tablet (50 mg total) by mouth daily as needed. take 1 tablet by mouth every 6 hours if needed    Dispense:  75 tablet    Refill:  5     Follow-up: Return in about 6 months (around 12/14/2016).  Sanda Linger, MD

## 2016-06-15 NOTE — Progress Notes (Signed)
Pre visit review using our clinic review tool, if applicable. No additional management support is needed unless otherwise documented below in the visit note. 

## 2016-06-16 DIAGNOSIS — H401133 Primary open-angle glaucoma, bilateral, severe stage: Secondary | ICD-10-CM | POA: Diagnosis not present

## 2016-06-23 ENCOUNTER — Telehealth: Payer: Self-pay

## 2016-06-23 NOTE — Telephone Encounter (Signed)
Clotrimazole and betamethasone dipropionate cream - rf is rq. Please advise.

## 2016-06-25 ENCOUNTER — Other Ambulatory Visit: Payer: Self-pay | Admitting: Internal Medicine

## 2016-06-25 DIAGNOSIS — B353 Tinea pedis: Secondary | ICD-10-CM

## 2016-06-25 MED ORDER — CLOTRIMAZOLE 1 % EX CREA: 1.0000 "application " | TOPICAL_CREAM | Freq: Two times a day (BID) | CUTANEOUS | 2 refills | Status: DC

## 2016-06-26 DIAGNOSIS — H401133 Primary open-angle glaucoma, bilateral, severe stage: Secondary | ICD-10-CM | POA: Diagnosis not present

## 2016-08-08 ENCOUNTER — Other Ambulatory Visit: Payer: Self-pay | Admitting: Internal Medicine

## 2016-10-20 ENCOUNTER — Telehealth: Payer: Self-pay | Admitting: Internal Medicine

## 2016-10-20 NOTE — Telephone Encounter (Signed)
Called patient to schedule awv. Patient did not answer. Will try to call patient again at a later time.  °

## 2016-11-14 ENCOUNTER — Encounter: Payer: Self-pay | Admitting: Internal Medicine

## 2016-11-14 ENCOUNTER — Ambulatory Visit (INDEPENDENT_AMBULATORY_CARE_PROVIDER_SITE_OTHER): Payer: Medicare Other | Admitting: Internal Medicine

## 2016-11-14 ENCOUNTER — Other Ambulatory Visit (INDEPENDENT_AMBULATORY_CARE_PROVIDER_SITE_OTHER): Payer: Medicare Other

## 2016-11-14 VITALS — BP 118/70 | HR 89 | Temp 97.9°F | Ht 66.0 in | Wt 194.2 lb

## 2016-11-14 DIAGNOSIS — E559 Vitamin D deficiency, unspecified: Secondary | ICD-10-CM

## 2016-11-14 DIAGNOSIS — E118 Type 2 diabetes mellitus with unspecified complications: Secondary | ICD-10-CM

## 2016-11-14 DIAGNOSIS — E785 Hyperlipidemia, unspecified: Secondary | ICD-10-CM

## 2016-11-14 DIAGNOSIS — D539 Nutritional anemia, unspecified: Secondary | ICD-10-CM

## 2016-11-14 DIAGNOSIS — I10 Essential (primary) hypertension: Secondary | ICD-10-CM

## 2016-11-14 DIAGNOSIS — F4322 Adjustment disorder with anxiety: Secondary | ICD-10-CM

## 2016-11-14 LAB — COMPREHENSIVE METABOLIC PANEL
ALBUMIN: 3.2 g/dL — AB (ref 3.5–5.2)
ALT: 33 U/L (ref 0–53)
AST: 66 U/L — AB (ref 0–37)
Alkaline Phosphatase: 155 U/L — ABNORMAL HIGH (ref 39–117)
BILIRUBIN TOTAL: 1.8 mg/dL — AB (ref 0.2–1.2)
BUN: 29 mg/dL — AB (ref 6–23)
CO2: 30 mEq/L (ref 19–32)
CREATININE: 1.66 mg/dL — AB (ref 0.40–1.50)
Calcium: 9.5 mg/dL (ref 8.4–10.5)
Chloride: 94 mEq/L — ABNORMAL LOW (ref 96–112)
GFR: 50.42 mL/min — ABNORMAL LOW (ref >60.00)
GLUCOSE: 288 mg/dL — AB (ref 70–99)
Potassium: 3.6 mEq/L (ref 3.5–5.1)
SODIUM: 129 meq/L — AB (ref 135–145)
Total Protein: 8.2 g/dL (ref 6.0–8.3)

## 2016-11-14 LAB — CBC WITH DIFFERENTIAL/PLATELET
BASOS ABS: 0 10*3/uL (ref 0.0–0.1)
Basophils Relative: 0.4 % (ref 0.0–3.0)
EOS ABS: 0.1 10*3/uL (ref 0.0–0.7)
EOS PCT: 2.6 % (ref 0.0–5.0)
HCT: 40.6 % (ref 39.0–52.0)
HEMOGLOBIN: 14 g/dL (ref 13.0–17.0)
LYMPHS ABS: 1.2 10*3/uL (ref 0.7–4.0)
Lymphocytes Relative: 24.5 % (ref 12.0–46.0)
MCHC: 34.5 g/dL (ref 30.0–36.0)
MCV: 106 fl — ABNORMAL HIGH (ref 78.0–100.0)
MONO ABS: 0.8 10*3/uL (ref 0.1–1.0)
Monocytes Relative: 15.9 % — ABNORMAL HIGH (ref 3.0–12.0)
NEUTROS PCT: 56.6 % (ref 43.0–77.0)
Neutro Abs: 2.8 10*3/uL (ref 1.4–7.7)
Platelets: 136 10*3/uL — ABNORMAL LOW (ref 150.0–400.0)
RBC: 3.83 Mil/uL — AB (ref 4.22–5.81)
RDW: 14.5 % (ref 11.5–15.5)
WBC: 5 10*3/uL (ref 4.0–10.5)

## 2016-11-14 LAB — LIPID PANEL
CHOLESTEROL: 102 mg/dL (ref 0–200)
HDL: 30.5 mg/dL — ABNORMAL LOW (ref >39.00)
LDL Cholesterol: 54 mg/dL (ref 0–99)
NONHDL: 71.97
Total CHOL/HDL Ratio: 3
Triglycerides: 91 mg/dL (ref 0.0–149.0)
VLDL: 18.2 mg/dL (ref 0.0–40.0)

## 2016-11-14 LAB — TSH: TSH: 1.44 u[IU]/mL (ref 0.35–4.50)

## 2016-11-14 LAB — HEMOGLOBIN A1C: HEMOGLOBIN A1C: 12 % — AB (ref 4.6–6.5)

## 2016-11-14 LAB — VITAMIN D 25 HYDROXY (VIT D DEFICIENCY, FRACTURES): VITD: 89.58 ng/mL (ref 30.00–100.00)

## 2016-11-14 MED ORDER — INSULIN PEN NEEDLE 32G X 6 MM MISC: 1.0000 | Freq: Every day | 3 refills | Status: DC

## 2016-11-14 MED ORDER — DIAZEPAM 5 MG PO TABS: 5.0000 mg | ORAL_TABLET | Freq: Two times a day (BID) | ORAL | 1 refills | Status: DC | PRN

## 2016-11-14 MED ORDER — INSULIN GLARGINE 300 UNIT/ML ~~LOC~~ SOPN: 30.0000 [IU] | PEN_INJECTOR | Freq: Every day | SUBCUTANEOUS | 11 refills | Status: DC

## 2016-11-14 NOTE — Progress Notes (Signed)
Pre visit review using our clinic review tool, if applicable. No additional management support is needed unless otherwise documented below in the visit note. 

## 2016-11-14 NOTE — Patient Instructions (Signed)
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
## 2016-11-15 ENCOUNTER — Encounter: Payer: Self-pay | Admitting: Internal Medicine

## 2016-12-01 ENCOUNTER — Other Ambulatory Visit: Payer: Self-pay | Admitting: Internal Medicine

## 2016-12-13 ENCOUNTER — Ambulatory Visit: Payer: Medicare Other | Admitting: Endocrinology

## 2017-01-01 ENCOUNTER — Ambulatory Visit (INDEPENDENT_AMBULATORY_CARE_PROVIDER_SITE_OTHER): Payer: Medicare Other | Admitting: Internal Medicine

## 2017-01-01 ENCOUNTER — Encounter: Payer: Self-pay | Admitting: Internal Medicine

## 2017-01-01 VITALS — BP 120/64 | HR 78 | Temp 97.9°F | Resp 16 | Ht 66.0 in | Wt 200.2 lb

## 2017-01-01 DIAGNOSIS — E118 Type 2 diabetes mellitus with unspecified complications: Secondary | ICD-10-CM | POA: Diagnosis not present

## 2017-01-01 LAB — POCT GLUCOSE (DEVICE FOR HOME USE): POC GLUCOSE: 386 mg/dL — AB (ref 70–99)

## 2017-01-01 MED ORDER — METFORMIN HCL ER 500 MG PO TB24: 1000.0000 mg | ORAL_TABLET | Freq: Every day | ORAL | 1 refills | Status: DC

## 2017-01-01 MED ORDER — GLIMEPIRIDE 2 MG PO TABS: 2.0000 mg | ORAL_TABLET | Freq: Every day | ORAL | 1 refills | Status: DC

## 2017-01-01 NOTE — Patient Instructions (Signed)

## 2017-01-01 NOTE — Progress Notes (Signed)
Subjective:  Patient ID: [name], male    DOB: March 09, 1926  Age: 83 y.o. MRN: [id number]  CC: Diabetes   HPI Aseem Sessums presents for f/up - The last I saw him his A1c was up to 12% so I asked him to start using a basal insulin but his daughter tells me she never picked up the prescription because she doesn't think he can give himself insulin and there is nobody in the home with him to give him the insulin. She tells me that she travels to [place] during the week for work and can't reliably given insulin. She wants to know if there are other options. He is not willing to be placed in a nursing home or personal care home. She also tells me that he drinks a liter of Pepsi every 2 days and has extremely poor diet.  Outpatient Medications Prior to Visit  Medication Sig Dispense Refill  . allopurinol (ZYLOPRIM) 100 MG tablet Take 1 tablet (100 mg total) by mouth daily. 90 tablet 3  . aspirin 81 MG tablet Take 81 mg by mouth daily.      . brimonidine (ALPHAGAN) 0.2 % ophthalmic solution Place 1 drop into the right eye 3 times daily.    . clotrimazole (LOTRIMIN) 1 % cream Apply 1 application topically 2 (two) times daily. 60 g 2  . colchicine 0.6 MG tablet Take 1 tablet (0.6 mg total) by mouth 2 (two) times daily. 180 tablet 3  . diazepam (VALIUM) 5 MG tablet Take 1 tablet (5 mg total) by mouth every 12 (twelve) hours as needed for anxiety. 60 tablet 1  . dorzolamide-timolol (COSOPT) 22.3-6.8 MG/ML ophthalmic solution Place 1 drop into both eyes 2 (two) times daily.     . Insulin Glargine (TOUJEO SOLOSTAR) 300 UNIT/ML SOPN Inject 30 Units into the skin daily. 1.5 mL 11  . Insulin Pen Needle (NOVOFINE) 32G X 6 MM MISC 1 Act by Does not apply route daily. 100 each 3  . latanoprost (XALATAN) 0.005 % ophthalmic solution Place 1 drop into both eyes at bedtime.    Marland Kitchen losartan-hydrochlorothiazide (HYZAAR) 50-12.5 MG tablet take 1 tablet by mouth once daily 90 tablet 3  . meclizine (ANTIVERT) 25 MG  tablet take 1/2 to 1 tablet by mouth every 4 hours if needed for dizziness 50 tablet 6  . metoprolol succinate (TOPROL-XL) 50 MG 24 hr tablet Take 1 tablet (50 mg total) by mouth daily. Take with or immediately following a meal. 90 tablet 3  . pilocarpine (PILOCAR) 1 % ophthalmic solution Place 1 drop into the right eye 3 (three) times daily.    Marland Kitchen RA VITAMIN D-3 2000 units CAPS take 1 capsule by mouth once daily 90 capsule 3  . simvastatin (ZOCOR) 40 MG tablet take 1 tablet by mouth at bedtime 90 tablet 1  . traMADol (ULTRAM) 50 MG tablet Take 1 tablet (50 mg total) by mouth daily as needed. take 1 tablet by mouth every 6 hours if needed 75 tablet 5   No facility-administered medications prior to visit.     ROS Review of Systems  Constitutional: Negative.  Negative for diaphoresis, fatigue and unexpected weight change.  HENT: Negative.   Eyes: Negative for visual disturbance.  Respiratory: Negative for cough, chest tightness, shortness of breath and wheezing.   Cardiovascular: Negative.  Negative for chest pain, palpitations and leg swelling.  Gastrointestinal: Negative for abdominal pain, constipation, diarrhea, nausea and vomiting.  Endocrine: Positive for polydipsia, polyphagia and polyuria.  Genitourinary:  Negative.  Negative for difficulty urinating.  Musculoskeletal: Negative.  Negative for back pain and myalgias.  Skin: Negative.  Negative for color change and rash.  Allergic/Immunologic: Negative.   Neurological: Negative.   Hematological: Negative for adenopathy. Does not bruise/bleed easily.  Psychiatric/Behavioral: Negative.     Objective:  BP 120/64 (BP Location: Left Arm, Patient Position: Sitting, Cuff Size: Normal)   Pulse 78   Temp 97.9 F (36.6 C) (Oral)   Resp 16   Ht 5\' 6"  (1.676 m)   Wt 200 lb 4 oz (90.8 kg)   SpO2 98%   BMI 32.32 kg/m   BP Readings from Last 3 Encounters:  01/01/17 120/64  11/14/16 118/70  06/15/16 112/70    Wt Readings from Last 3  Encounters:  01/01/17 200 lb 4 oz (90.8 kg)  11/14/16 194 lb 4 oz (88.1 kg)  06/15/16 201 lb 12 oz (91.5 kg)    Physical Exam  Constitutional: He is oriented to person, place, and time. No distress.  HENT:  Mouth/Throat: Oropharynx is clear and moist. No oropharyngeal exudate.  Eyes: Conjunctivae are normal. Right eye exhibits no discharge. Left eye exhibits no discharge. No scleral icterus.  Neck: Normal range of motion. Neck supple. No JVD present. No thyromegaly present.  Cardiovascular: Normal rate, regular rhythm and intact distal pulses.  Exam reveals no gallop.   No murmur heard. Pulmonary/Chest: Effort normal and breath sounds normal. No respiratory distress. He has no wheezes. He has no rales. He exhibits no tenderness.  Abdominal: Soft. Bowel sounds are normal. He exhibits no distension and no mass. There is no tenderness. There is no rebound and no guarding.  Musculoskeletal: Normal range of motion. He exhibits no edema, tenderness or deformity.  Lymphadenopathy:    He has no cervical adenopathy.  Neurological: He is alert and oriented to person, place, and time.  Skin: Skin is warm and dry. No rash noted. He is not diaphoretic. No erythema. No pallor.  Vitals reviewed.   Lab Results  Component Value Date   WBC 5.0 11/14/2016   HGB 14.0 11/14/2016   HCT 40.6 11/14/2016   PLT 136.0 (L) 11/14/2016   GLUCOSE 288 (H) 11/14/2016   CHOL 102 11/14/2016   TRIG 91.0 11/14/2016   HDL 30.50 (L) 11/14/2016   LDLCALC 54 11/14/2016   ALT 33 11/14/2016   AST 66 (H) 11/14/2016   NA 129 (L) 11/14/2016   K 3.6 11/14/2016   CL 94 (L) 11/14/2016   CREATININE 1.66 (H) 11/14/2016   BUN 29 (H) 11/14/2016   CO2 30 11/14/2016   TSH 1.44 11/14/2016   PSA 0.46 02/16/2011   HGBA1C 12.0 (H) 11/14/2016   MICROALBUR 3.8 (H) 06/15/2016    Dg Chest 2 View  Result Date: 02/27/2013 *RADIOLOGY REPORT* Clinical Data: History of pipe smoking.  History of hypertension. No chest complaints.  CHEST - 2 VIEW Comparison: 02/23/2012. Findings: There is mild cardiac silhouette enlargement. Ectasia and nonaneurysmal calcification of the thoracic aorta are seen. Mediastinal and hilar contours appear stable. No pleural abnormality is evident.  No active pulmonary infiltrates are seen. No pulmonary masses are evident.  There is narrowing of some of the intervertebral disc spaces.  Multilevel marginal osteophyte formation is seen representing degenerative spondylosis. IMPRESSION: Mild cardiac silhouette enlargement which is slightly larger than on prior study.  No acute or active pulmonary or pleural abnormalities are seen.  Chronic bony findings are described above. Original Report Authenticated By: Onalee Hua Call    Assessment & Plan:  Genevie CheshireClyde was seen today for diabetes.  Diagnoses and all orders for this visit:  Type 2 diabetes mellitus with complication, without long-term current use of insulin (HCC)- [facility] if [facility] offer him some help within the home to get better control of his blood sugar and to administer his insulin. Will start 2 oral agents, metformin and an S2. -     POCT Glucose (Device for Home Use) -     [facility] -     glimepiride (AMARYL) 2 MG tablet; Take 1 tablet (2 mg total) by mouth daily before breakfast. -     metFORMIN (GLUCOPHAGE-XR) 500 MG 24 hr tablet; Take 2 tablets (1,000 mg total) by mouth daily with breakfast.   I am having Mr. Cherlynn PoloStaples start on glimepiride and metFORMIN. I am also having him maintain his aspirin, dorzolamide-timolol, latanoprost, pilocarpine, colchicine, metoprolol succinate, RA VITAMIN D-3, losartan-hydrochlorothiazide, allopurinol, brimonidine, traMADol, clotrimazole, meclizine, diazepam, Insulin Glargine, Insulin Pen Needle, and simvastatin.  Meds ordered this encounter  Medications  . glimepiride (AMARYL) 2 MG tablet    Sig: Take 1 tablet (2 mg total) by mouth daily before breakfast.    Dispense:  90 tablet    Refill:   1  . metFORMIN (GLUCOPHAGE-XR) 500 MG 24 hr tablet    Sig: Take 2 tablets (1,000 mg total) by mouth daily with breakfast.    Dispense:  180 tablet    Refill:  1     Follow-up: Return in about 3 months (around 04/03/2017).  Sanda Lingerhomas Doralee Kocak, MD

## 2017-01-05 ENCOUNTER — Other Ambulatory Visit: Payer: Self-pay | Admitting: *Deleted

## 2017-01-05 NOTE — Patient Outreach (Signed)
[facility]) Care Management  01/05/2017  Harold SagoClyde Evans Jul 16, 1925 [id number]   Referral received from care management assessment via primary MD office to assist with management of diabetes.  Per chart, member has history of hypertension, hyperlipidemia, osteoarthritis, glaucoma, and diabetes.  A1C increased from 6.7 in December 2017 to 12.0 in May 2018.  Call placed to member, spoke to daughter/caregiver [name].  Identity verified.  This care manager introduced self and purpose of call.  [facility] explained.  She agrees to program expressing concern regarding new diagnosis of diabetes and the need for insulin management.  She state that she and her daughter are the member's main caregivers, with one of them visiting daily.  She state that they will be able to provide the insulin injections, however neither of them have been taught how to administer them.  Member does not have the insulin, daughter state she was skeptical about obtaining until she received further instructions.  She is advised to obtain medication from pharmacy, agrees to have this care manager visit to teach herself and her daughter proper administration.  This care manager inquired about glucose monitoring.  She state member does not have meter, denies having this discussed during MD visit.  She also denies being educated on proper diabetes diet as she provides meals for member.  She is advised to that this care manager will contact primary MD office to inquire about glucose monitoring as well as expectations of insulin management.  Initial home visit scheduled for within the next 2 weeks.  Contact information for this care manager provided, advised to contact with questions.  Kemper DurieMonica [name], [place]RN, MSN [facility] Care Management  [facility] Care Manager [phone]

## 2017-01-08 DIAGNOSIS — H401133 Primary open-angle glaucoma, bilateral, severe stage: Secondary | ICD-10-CM | POA: Diagnosis not present

## 2017-01-08 DIAGNOSIS — Z79899 Other long term (current) drug therapy: Secondary | ICD-10-CM | POA: Diagnosis not present

## 2017-01-12 ENCOUNTER — Other Ambulatory Visit: Payer: Self-pay | Admitting: *Deleted

## 2017-01-12 ENCOUNTER — Telehealth: Payer: Self-pay | Admitting: *Deleted

## 2017-01-12 DIAGNOSIS — E118 Type 2 diabetes mellitus with unspecified complications: Secondary | ICD-10-CM

## 2017-01-12 MED ORDER — ONETOUCH DELICA LANCETS 33G MISC
3 refills | Status: AC
Start: 2017-01-12 — End: ?

## 2017-01-12 MED ORDER — ONETOUCH VERIO FLEX SYSTEM W/DEVICE KIT: 1.0000 | PACK | Freq: Every day | 0 refills | Status: AC

## 2017-01-12 MED ORDER — GLUCOSE BLOOD VI STRP: 1.0000 | ORAL_STRIP | Freq: Two times a day (BID) | 3 refills | Status: DC

## 2017-01-12 NOTE — Telephone Encounter (Signed)
Monica left msg on triage stating working w/pt and notice that he has not been checking his BS due to not having a meter. Requesting rx to be sent to pharmacy. Called pt/daughter to inform that we are sending BS monitor w/supplies out to pharmacy. Daughter has concerns about starting taking both pills and insulin. She stated that they have not started anything yet. She have picked up the pills but not insulin. She is going to start giving him the metformin and glimepiride and if BS are still high they will start the insulin. Called Monica back to make her aware. She has made appt for 7/11 w/family for assessment and try to help w/their concerns. [facility] [place] [facility]Raechel Chute/lmb

## 2017-01-12 NOTE — Patient Outreach (Addendum)
[facility] Care Management  01/12/2017  Harold SagoClyde Evans March 07, 1926 [id number]    Call placed to primary MD office to inquire about glucose meter and supplies.  Detailed message left on nurse triage line.  Will await call back.  If no call back, will follow up next week.   Update @ 1020:  Call received back from [name], [place] at primary MD office, regarding concern for glucose meter and supplies.  She state that member's daughter has been notified that prescription for supplies has been sent to pharmacy.  She is concerned that member has not stated taking new medications for diabetes management.  She is informed that member's daughter was concerned about education of proper administration, made aware that this care manager has home visit scheduled for next week to provide education and support.  Will follow up with member next week, will follow up with MD office as needed.   [name], [place]RN, MSN [facility] Care Management  [facility] Care Manager [phone]

## 2017-01-13 ENCOUNTER — Other Ambulatory Visit: Payer: Self-pay | Admitting: Internal Medicine

## 2017-01-13 DIAGNOSIS — M545 Low back pain: Secondary | ICD-10-CM

## 2017-01-13 DIAGNOSIS — M10049 Idiopathic gout, unspecified hand: Secondary | ICD-10-CM

## 2017-01-13 DIAGNOSIS — M1000 Idiopathic gout, unspecified site: Secondary | ICD-10-CM

## 2017-01-13 DIAGNOSIS — M159 Polyosteoarthritis, unspecified: Secondary | ICD-10-CM

## 2017-01-13 DIAGNOSIS — G8929 Other chronic pain: Secondary | ICD-10-CM

## 2017-01-13 DIAGNOSIS — M150 Primary generalized (osteo)arthritis: Secondary | ICD-10-CM

## 2017-01-15 NOTE — Telephone Encounter (Signed)
rx faxed to [facility].  

## 2017-01-17 ENCOUNTER — Other Ambulatory Visit: Payer: Self-pay | Admitting: *Deleted

## 2017-01-17 NOTE — Patient Outreach (Signed)
[facility] Care Management  01/17/2017  [name] July 18, 1925 [id number]   Initial home visit scheduled for this afternoon to review diabetes management, including insulin administration and glucose checks.  Call received from daughter stating that the pharmacy still does not have the glucose meter and that she received a call from the MD office saying they would try an oral agent prior to insulin.  She request visit be rescheduled to next week.  She state she will contact the pharmacy to check on status of meter in hopes of having it available by next week.    She state member love to drink sweet tea and Pepsi.  She report she has started buying him un-sweet tea and diet Pepsi.  Other ways to decrease carbohydrates and sugars in diet discussed, will continue education on diet next week during home visit.  She denies any urgent concerns at this time.  Kemper DurieMonica Devlynn Knoff, [place]RN, MSN [facility] Care Management  [facility] Care Manager [phone]

## 2017-01-24 ENCOUNTER — Ambulatory Visit: Payer: Self-pay | Admitting: *Deleted

## 2017-01-24 ENCOUNTER — Other Ambulatory Visit: Payer: Self-pay | Admitting: *Deleted

## 2017-01-24 NOTE — Patient Outreach (Signed)
[facility] Care Management  01/24/2017  Harold SagoClyde Evans [birth year] [id number]   Home visit scheduled for this afternoon.  [facility] morning requesting to reschedule, stating she still have not received the glucose meter from the pharmacy.  She state she has followed up with both the pharmacy and the MD office and will do so again today.  She report that she has not started giving the member the oral agents, Glimepiride and Metformin, as of yet due to not being able to check blood sugar.  Advised to start giving the oral agents as his A1C was greater than 12.  She verbalizes understanding, will follow up next week.  Kemper DurieMonica Kasidy Gianino, [place]RN, MSN [facility] Care Management  [facility] Care Manager [phone]

## 2017-01-31 ENCOUNTER — Other Ambulatory Visit: Payer: Self-pay | Admitting: *Deleted

## 2017-01-31 ENCOUNTER — Encounter: Payer: Self-pay | Admitting: *Deleted
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
## 2017-02-01 ENCOUNTER — Telehealth: Payer: Self-pay | Admitting: Internal Medicine

## 2017-02-01 NOTE — Telephone Encounter (Signed)
[facility] called stating that the pt has been trying to get a glucose meter from [facility] on [address] but they are having issues with the insurance covering it because of a coding problem. Please advise.

## 2017-02-01 NOTE — Telephone Encounter (Signed)
Pharmacy is refaxing form.  Form received.  Given to PCP to sign.

## 2017-02-02 NOTE — Telephone Encounter (Signed)
Form signed and faxed back to pharmacy.

## 2017-02-06 ENCOUNTER — Other Ambulatory Visit: Payer: Self-pay | Admitting: *Deleted

## 2017-02-06 NOTE — Telephone Encounter (Signed)
Are they sending a new form?

## 2017-02-06 NOTE — Telephone Encounter (Signed)
This has been signed and faxed

## 2017-02-06 NOTE — Patient Outreach (Signed)
[facility](THN) Care Management  02/06/2017  Maralyn SagoClyde Batalla 09-26-1925 [id number]   [facility] inquire about status of glucose meter.  Notified that form received were for lancets only, form still needed for meter and strips.  Call placed today to primary MD office to request form be sent to pharmacy for strips and meter.  Spoke with Lelon MastSamantha, will have forms faxed.  Kemper DurieMonica Alger Kerstein, [place]RN, MSN [facility] Care Management  [facility] Care Manager [phone]

## 2017-02-06 NOTE — Telephone Encounter (Signed)
Monica called back and states the form was only for the Lantus but it should have been for the meter and strips. They need a new form sent in for this.

## 2017-02-06 NOTE — Telephone Encounter (Signed)
Called pharmacy and requested form for the test strips and the meter.

## 2017-02-06 NOTE — Telephone Encounter (Signed)
No she did not mention that

## 2017-02-09 ENCOUNTER — Other Ambulatory Visit: Payer: Self-pay | Admitting: *Deleted

## 2017-02-09 ENCOUNTER — Telehealth: Payer: Self-pay | Admitting: *Deleted

## 2017-02-09 DIAGNOSIS — E118 Type 2 diabetes mellitus with unspecified complications: Secondary | ICD-10-CM

## 2017-02-09 MED ORDER — GLUCOSE BLOOD VI STRP: 1.0000 | ORAL_STRIP | Freq: Two times a day (BID) | 3 refills | Status: DC

## 2017-02-09 MED ORDER — GLUCOSE BLOOD VI STRP: 1.0000 | ORAL_STRIP | Freq: Two times a day (BID) | 3 refills | Status: AC

## 2017-02-09 NOTE — Telephone Encounter (Signed)
Monica left msg on triage stating per pt he did not received one touch verio strips, but he did on the monitor and lancets. [facility]...Raechel Chute/lmb

## 2017-02-09 NOTE — Patient Outreach (Signed)
[facility]N) Care Management  02/09/2017  Harold SagoClyde Evans Dec 11, 1925 [id number]   [facility] to follow up on prescription for glucose meter and supplies.  They state that they now have the meter and lancets ready, but do not have form to supply strips.  Report sending all forms to MD office, but not receiving one back for strips.  Call then placed to MD office, spoke with Valentina GuLucy to provide update on communication with pharmacy.  She state they have sent over all necessary paperwork/prescriptions for all supplies, unsure why they have not received it.  She will re-send forms for strips.  Will follow up with pharmacy and member's daughter next week to confirm all supplies are obtained.  Kemper DurieMonica Gisselle Evans, [place]RN, MSN [facility] Care Management  [facility] Care Manager [phone]

## 2017-02-09 NOTE — Telephone Encounter (Signed)
[facility] not sure why [facility] didn't received rx too we received confirmation that they did received. Will go back in and resend electronically I also faxed through epic as well.../lmb  glucose blood (ONETOUCH VERIO) test strip 100 each 3 01/12/2017    Sig - Route: 1 each by Other route 2 (two) times daily. Use to check blood sugars twice a day - Other   Patient not taking: Reported on 01/31/2017       Sent to pharmacy as: glucose blood (ONETOUCH VERIO) test strip   Notes to Pharmacy: E11.8   E-Prescribing Status: Receipt confirmed by pharmacy (01/12/2017 10:34 AM EDT)

## 2017-02-13 ENCOUNTER — Telehealth: Payer: Self-pay | Admitting: Internal Medicine

## 2017-02-13 ENCOUNTER — Other Ambulatory Visit: Payer: Self-pay | Admitting: *Deleted

## 2017-02-13 NOTE — Telephone Encounter (Signed)
As in previous notes regarding this. This has been faxed. I will refax the paper work.

## 2017-02-13 NOTE — Telephone Encounter (Signed)
States has been between office and pharmacy in regard to patients diabetic supplies.  States pharmacy got everything they needed except for a DWO form for the test strips.  [facility] uses [facility] on [address].  Is requesting this to be followed up on.

## 2017-02-13 NOTE — Patient Outreach (Signed)
[facility]N) Care Management  02/13/2017  [name] 12-17-1925 [id number]   [facility] to follow up on status of glucose meter and supplies.  They state they still have not received the DWO form for the test strips.  Advised that this care manager discussed with MD office last week and has faxed over all paperwork needed.  She report checking system again, states no DWO form on file for strips.  Call then placed to MD office, spoke to Tammy to request the DWO form be faxed again for test strips.  Will follow up with pharmacy within the next 2 days.  Kemper DurieMonica Sanaii Caporaso, [place]RN, MSN [facility] Care Management  [facility] Care Manager [phone]

## 2017-02-15 ENCOUNTER — Other Ambulatory Visit: Payer: Self-pay | Admitting: *Deleted

## 2017-02-15 NOTE — Patient Outreach (Signed)
[facility]N) Care Management  02/15/2017  Harold SagoClyde Evans 1926/04/05 [id number]   [facility] to inquire about glucose meter and supplies.  They state that they still have not received necessary paperwork from MD office.  Informed that per office, all paperwork has been faxed multiple times as requested.  This care manager inquired about any other assistance that can be offered to expedite member receiving supplies.  Pharmacy continue to deny receiving form for strips.  State they will resend forms to MD office requesting completion.  Will follow up with pharmacy again within 2 business days.  Will follow up with daughter once notification received that supplies are ready.  Harold DurieMonica Anyssa Evans, [place]RN, MSN [facility] Care Management  [facility] Care Manager [phone]

## 2017-02-16 ENCOUNTER — Other Ambulatory Visit: Payer: Self-pay | Admitting: Internal Medicine

## 2017-02-23 ENCOUNTER — Other Ambulatory Visit: Payer: Self-pay | Admitting: *Deleted

## 2017-02-23 NOTE — Patient Outreach (Signed)
[facility]) Care Management  02/23/2017  Jaimee Bosher 04-Sep-1925 [id number]   [facility] to inquire about glucose meter and supplies.  Spoke with [place], she report they still have not received the DWO form for the test strips.  This care manager inquired about ways assistance can be provided to expedite the process as it has been over a month since original order for meter and supplies were written.  This care manager will obtain form from pharmacy on Monday in person and take to MD office to complete, then will hand deliver form back to pharmacy.  Update will be provided to daughter once form is delivered.  Kemper Durie, [place], MSN [facility] Care Management  [facility] Manager [phone]

## 2017-02-28 ENCOUNTER — Other Ambulatory Visit: Payer: Self-pay | Admitting: *Deleted

## 2017-02-28 NOTE — Patient Outreach (Signed)
[facility]) Care Management  02/28/2017  Zyheim Cords [birth year] [id number]    Late entry for 02/27/17:  This care manager was able to go to [facility] to obtain [facility] form for glucose strips.  Form taken to primary MD office and hand delivered back to [facility].  Pharmacy now able to fill complete request for meter and supplies.     Update 02/28/17:  Call placed to daughter Almira Coaster to provide update and inform her that supplies are ready for pickup, no answer.  HIPAA compliant voice message left, will await call back.  Kemper Durie, [place], MSN [facility] Care Management  [facility] Manager [phone]

## 2017-03-05 ENCOUNTER — Other Ambulatory Visit: Payer: Self-pay | Admitting: *Deleted

## 2017-03-05 NOTE — Patient Outreach (Signed)
[facility]) Care Management   03/05/2017  [name] 1925/11/13 [id number]  [name] is an 83 y.o. male  Subjective:   Member alert and oriented x3, denies pain or discomfort at this time.  Objective:   Review of Systems  Constitutional: Negative.   HENT: Negative.   Eyes: Negative.   Respiratory: Negative.   Cardiovascular: Positive for leg swelling.  Gastrointestinal: Negative.   Genitourinary: Negative.   Musculoskeletal: Negative.   Skin: Negative.   Neurological: Negative.   Endo/Heme/Allergies: Negative.   Psychiatric/Behavioral: Negative.     Physical Exam  Constitutional: He is oriented to person, place, and time. He appears well-developed and well-nourished.  Neck: Normal range of motion.  Cardiovascular: Normal rate, regular rhythm and normal heart sounds.   Respiratory: Effort normal and breath sounds normal.  GI: Soft. Bowel sounds are normal.  Musculoskeletal: Normal range of motion.  Neurological: He is alert and oriented to person, place, and time.  Skin: Skin is warm and dry.   BP (!) 110/56 (BP Location: Right Arm, Patient Position: Sitting, Cuff Size: Normal)   Pulse 61   SpO2 97%   Encounter Medications:   Outpatient Encounter Prescriptions as of 03/05/2017  Medication Sig Note  . allopurinol (ZYLOPRIM) 100 MG tablet Take 1 tablet (100 mg total) by mouth daily.   Marland Kitchen aspirin 81 MG tablet Take 81 mg by mouth daily.     . Blood Glucose Monitoring Suppl ([place]) w/Device KIT 1 Device by Does not apply route daily. Use to check blood sugars twice a day (Patient not taking: Reported on 01/31/2017)   . brimonidine (ALPHAGAN) 0.2 % ophthalmic solution Place 1 drop into the right eye 3 times daily. 06/15/2016: Received from: [facility]  . clotrimazole (LOTRIMIN) 1 % cream Apply 1 application topically 2 (two) times daily.   . colchicine 0.6 MG tablet take 1 tablet by mouth twice a day   .  diazepam (VALIUM) 5 MG tablet Take 1 tablet (5 mg total) by mouth every 12 (twelve) hours as needed for anxiety. (Patient not taking: Reported on 01/31/2017)   . dorzolamide-timolol (COSOPT) 22.3-6.8 MG/ML ophthalmic solution Place 1 drop into both eyes 2 (two) times daily.    Marland Kitchen glimepiride (AMARYL) 2 MG tablet Take 1 tablet (2 mg total) by mouth daily before breakfast.   . glucose blood (ONETOUCH VERIO) test strip 1 each by Other route 2 (two) times daily. Use to check blood sugars twice a day   . Insulin Glargine (TOUJEO SOLOSTAR) 300 UNIT/ML SOPN Inject 30 Units into the skin daily. (Patient not taking: Reported on 01/31/2017)   . Insulin Pen Needle (NOVOFINE) 32G X 6 MM MISC 1 Act by Does not apply route daily. (Patient not taking: Reported on 01/31/2017)   . latanoprost (XALATAN) 0.005 % ophthalmic solution Place 1 drop into both eyes at bedtime.   Marland Kitchen losartan-hydrochlorothiazide (HYZAAR) 50-12.5 MG tablet take 1 tablet by mouth once daily   . meclizine (ANTIVERT) 25 MG tablet take 1/2 to 1 tablet by mouth every 4 hours if needed for dizziness   . metFORMIN (GLUCOPHAGE-XR) 500 MG 24 hr tablet Take 2 tablets (1,000 mg total) by mouth daily with breakfast.   . metoprolol succinate (TOPROL-XL) 50 MG 24 hr tablet Take 1 tablet (50 mg total) by mouth daily. Take with or immediately following a meal.   . ONETOUCH DELICA LANCETS 13K MISC Use to help check blood sugars twice a day (Patient not  taking: Reported on 01/31/2017)   . pilocarpine (PILOCAR) 1 % ophthalmic solution Place 1 drop into the right eye 3 (three) times daily.   Marland Kitchen RA VITAMIN D-3 2000 units CAPS take 1 capsule by mouth once daily   . simvastatin (ZOCOR) 40 MG tablet take 1 tablet by mouth at bedtime   . traMADol (ULTRAM) 50 MG tablet TAKE 1 TABLET BY MOUTH DAILY AS NEEDED AND TAKE 1 TABLET EVERY 6 HOURS IF NEEDED    No facility-administered encounter medications on file as of 03/05/2017.     Functional Status:   In your present state of  health, do you have any difficulty performing the following activities: 01/31/2017  Hearing? N  Vision? Y  Difficulty concentrating or making decisions? Y  Walking or climbing stairs? Y  Dressing or bathing? N  Doing errands, shopping? Y  Preparing Food and eating ? N  Using the Toilet? N  In the past six months, have you accidently leaked urine? N  Do you have problems with loss of bowel control? N  Managing your Medications? Y  Managing your Finances? Y  Housekeeping or managing your Housekeeping? Y  Some recent data might be hidden    Fall/Depression Screening:    Fall Risk  01/31/2017 12/19/2015 12/15/2015  Falls in the past year? No No No   PHQ 2/9 Scores 01/31/2017 12/19/2015 12/15/2015 12/14/2014 02/27/2013  PHQ - 2 Score 0 0 0 0 1    Assessment:    Met with member and daughter at scheduled time.  They both deny any concerns about member's health at this time.  Daughter able to check member's blood sugar with minimal assistance after instruction of meter, blood sugar 120, non-fasting.  She state that she has continued to adhere to his diabetic diet, decreasing carbohydrates and sugars in his diet.  He has follow up appointment with primary MD next month, hoping to have A1C rechecked at that time.  They report compliance with medications, will start PO diabetes agents tomorrow as she was waiting to have glucose meter and supplies in before starting.  They both deny any further questions at this time.    Plan:   Will follow up with routine home visit next month.  THN CM Care Plan Problem One     Most Recent Value  Care Plan Problem One  Knowledge deficit regarding diabetes management as evidenced by daughter's stated concern for knowledge  Role Documenting the Problem One  Care Management [place] for Problem One  Active  THN Long Term Goal   Member's A1C will decrease to </= 10 within the next 3 months  THN Long Term Goal Start Date  01/31/17  Interventions for Problem  One Long Term Goal  Daughter provided with diabetes edcuation book, edcuated on importance of managing blood sugars through diet as well as medications.  THN CM Short Term Goal #1   Member will have glucose meter within the next 2 weeks  THN CM Short Term Goal #1 Start Date  01/31/17  [facility] CM Short Term Goal #1 Met Date  03/05/17  Interventions for Short Term Goal #1  Call placed to [facility] primary MD and pharmacy to inquire about order for glucose meter.  THN CM Short Term Goal #2   [name] will report decrease in carbohydrates/sugars in diet over the next 4 weeks  THN CM Short Term Goal #2 Start Date  01/31/17  [facility] CM Short Term Goal #2 Met Date  03/05/17  Interventions for Short Term Goal #2  Daughter provided with sample meal plan to adhere to 1800 calorie diet.  THN CM Short Term Goal #3  Member will have blood sugar checked daily over the next 4 weeks  THN CM Short Term Goal #3 Start Date  03/05/17  Interventions for Short Tern Goal #3  Confirmed member has new glucose meter, daughter taught correct technique for checking blood sugar     Valente David, RN, MSN [place] Manager [phone]

## 2017-03-12 ENCOUNTER — Other Ambulatory Visit: Payer: Self-pay | Admitting: Internal Medicine

## 2017-03-12 DIAGNOSIS — I10 Essential (primary) hypertension: Secondary | ICD-10-CM

## 2017-04-02 ENCOUNTER — Encounter: Payer: Self-pay | Admitting: Internal Medicine

## 2017-04-02 ENCOUNTER — Ambulatory Visit (INDEPENDENT_AMBULATORY_CARE_PROVIDER_SITE_OTHER): Payer: Medicare Other | Admitting: Internal Medicine

## 2017-04-02 VITALS — BP 120/64 | HR 55 | Temp 97.5°F | Resp 16 | Ht 66.0 in | Wt 201.0 lb

## 2017-04-02 DIAGNOSIS — I10 Essential (primary) hypertension: Secondary | ICD-10-CM | POA: Diagnosis not present

## 2017-04-02 DIAGNOSIS — Z794 Long term (current) use of insulin: Secondary | ICD-10-CM | POA: Diagnosis not present

## 2017-04-02 DIAGNOSIS — E118 Type 2 diabetes mellitus with unspecified complications: Secondary | ICD-10-CM

## 2017-04-02 DIAGNOSIS — E785 Hyperlipidemia, unspecified: Secondary | ICD-10-CM | POA: Diagnosis not present

## 2017-04-02 LAB — POCT GLYCOSYLATED HEMOGLOBIN (HGB A1C): Hemoglobin A1C: 6.4

## 2017-04-02 NOTE — Progress Notes (Signed)
Subjective:  Patient ID: [name], male    DOB: 12-22-1925  Age: 83 y.o. MRN: [id number]  CC: Diabetes and Hypertension   HPI Fong Mccarry presents for f/up - His daughter tells me that he is not receiving any medications for diabetes. Over the last few months she has been controlling his blood sugars with strict dietary limitations. She's been checking his blood sugar at home for the past few weeks and all the numbers have been less than 120. He is at his baseline. He complains of joint and muscle aches and wants to stop taking the statin.  Outpatient Medications Prior to Visit  Medication Sig Dispense Refill  . allopurinol (ZYLOPRIM) 100 MG tablet Take 1 tablet (100 mg total) by mouth daily. 90 tablet 3  . aspirin 81 MG tablet Take 81 mg by mouth daily.      . brimonidine (ALPHAGAN) 0.2 % ophthalmic solution Place 1 drop into the right eye 3 times daily.    . colchicine 0.6 MG tablet take 1 tablet by mouth twice a day 180 tablet 3  . dorzolamide-timolol (COSOPT) 22.3-6.8 MG/ML ophthalmic solution Place 1 drop into both eyes 2 (two) times daily.     Marland Kitchen glucose blood (ONETOUCH VERIO) test strip 1 each by Other route 2 (two) times daily. Use to check blood sugars twice a day 100 each 3  . latanoprost (XALATAN) 0.005 % ophthalmic solution Place 1 drop into both eyes at bedtime.    Marland Kitchen losartan-hydrochlorothiazide (HYZAAR) 50-12.5 MG tablet take 1 tablet by mouth once daily 90 tablet 1  . meclizine (ANTIVERT) 25 MG tablet take 1/2 to 1 tablet by mouth every 4 hours if needed for dizziness 50 tablet 6  . pilocarpine (PILOCAR) 1 % ophthalmic solution Place 1 drop into the right eye 3 (three) times daily.    Marland Kitchen RA VITAMIN D-3 2000 units CAPS take 1 capsule by mouth once daily 90 capsule 3  . traMADol (ULTRAM) 50 MG tablet TAKE 1 TABLET BY MOUTH DAILY AS NEEDED AND TAKE 1 TABLET EVERY 6 HOURS IF NEEDED 75 tablet 5  . clotrimazole (LOTRIMIN) 1 % cream Apply 1 application topically 2 (two) times  daily. 60 g 2  . simvastatin (ZOCOR) 40 MG tablet take 1 tablet by mouth at bedtime 90 tablet 1  . Blood Glucose Monitoring Suppl (ONETOUCH VERIO FLEX SYSTEM) w/Device KIT 1 Device by Does not apply route daily. Use to check blood sugars twice a day (Patient not taking: Reported on 01/31/2017) 1 kit 0  . ONETOUCH DELICA LANCETS 46K MISC Use to help check blood sugars twice a day (Patient not taking: Reported on 01/31/2017) 100 each 3  . diazepam (VALIUM) 5 MG tablet Take 1 tablet (5 mg total) by mouth every 12 (twelve) hours as needed for anxiety. (Patient not taking: Reported on 01/31/2017) 60 tablet 1  . glimepiride (AMARYL) 2 MG tablet Take 1 tablet (2 mg total) by mouth daily before breakfast. (Patient not taking: Reported on 04/02/2017) 90 tablet 1  . Insulin Glargine (TOUJEO SOLOSTAR) 300 UNIT/ML SOPN Inject 30 Units into the skin daily. (Patient not taking: Reported on 01/31/2017) 1.5 mL 11  . Insulin Pen Needle (NOVOFINE) 32G X 6 MM MISC 1 Act by Does not apply route daily. (Patient not taking: Reported on 01/31/2017) 100 each 3  . metFORMIN (GLUCOPHAGE-XR) 500 MG 24 hr tablet Take 2 tablets (1,000 mg total) by mouth daily with breakfast. 180 tablet 1  . metoprolol succinate (TOPROL-XL) 50 MG  24 hr tablet take 1 tablet by mouth once daily with meals or IMMEDIATELY FOLLOWING A MEAL (Patient not taking: Reported on 04/02/2017) 90 tablet 3   No facility-administered medications prior to visit.     ROS Review of Systems  Constitutional: Negative for appetite change, diaphoresis, fatigue and unexpected weight change.  HENT: Negative.  Negative for sinus pressure and trouble swallowing.   Eyes: Negative.   Respiratory: Negative.  Negative for cough, chest tightness, shortness of breath and wheezing.   Cardiovascular: Negative for chest pain, palpitations and leg swelling.  Gastrointestinal: Negative for abdominal pain, constipation, diarrhea, nausea and vomiting.  Endocrine: Negative for polydipsia,  polyphagia and polyuria.  Genitourinary: Negative.  Negative for difficulty urinating.  Musculoskeletal: Positive for arthralgias and myalgias.  Skin: Negative.   Allergic/Immunologic: Negative.   Neurological: Negative.  Negative for dizziness and weakness.  Hematological: Negative for adenopathy. Does not bruise/bleed easily.  Psychiatric/Behavioral: Negative.     Objective:  BP 120/64 (BP Location: Left Arm, Patient Position: Sitting, Cuff Size: Normal)   Pulse (!) 55   Temp (!) 97.5 F (36.4 C) (Oral)   Resp 16   Ht 5' 6"  (1.676 m)   Wt 201 lb (91.2 kg)   SpO2 98%   BMI 32.44 kg/m   BP Readings from Last 3 Encounters:  04/02/17 120/64  03/05/17 (!) 110/56  01/31/17 (!) 104/52    Wt Readings from Last 3 Encounters:  04/02/17 201 lb (91.2 kg)  01/31/17 202 lb (91.6 kg)  01/01/17 200 lb 4 oz (90.8 kg)    Physical Exam  Constitutional: No distress.  Frail Pleasantly demented Wheelchair-bound  HENT:  Mouth/Throat: No oropharyngeal exudate.  Eyes: Conjunctivae are normal. Right eye exhibits no discharge. Left eye exhibits no discharge. No scleral icterus.  Neck: Normal range of motion. Neck supple. No JVD present. No thyromegaly present.  Cardiovascular: Normal rate, regular rhythm and intact distal pulses.  Exam reveals no gallop and no friction rub.   No murmur heard. Pulmonary/Chest: Effort normal and breath sounds normal. No respiratory distress. He has no wheezes. He has no rales. He exhibits no tenderness.  Abdominal: Soft. Bowel sounds are normal. He exhibits no distension and no mass. There is no tenderness. There is no rebound and no guarding.  Musculoskeletal: Normal range of motion. He exhibits no edema, tenderness or deformity.  Lymphadenopathy:    He has no cervical adenopathy.  Neurological: He is alert.  Skin: Skin is warm and dry. No rash noted. He is not diaphoretic. No erythema. No pallor.  Vitals reviewed.   Lab Results  Component Value Date    WBC 5.0 11/14/2016   HGB 14.0 11/14/2016   HCT 40.6 11/14/2016   PLT 136.0 (L) 11/14/2016   GLUCOSE 288 (H) 11/14/2016   CHOL 102 11/14/2016   TRIG 91.0 11/14/2016   HDL 30.50 (L) 11/14/2016   LDLCALC 54 11/14/2016   ALT 33 11/14/2016   AST 66 (H) 11/14/2016   NA 129 (L) 11/14/2016   K 3.6 11/14/2016   CL 94 (L) 11/14/2016   CREATININE 1.66 (H) 11/14/2016   BUN 29 (H) 11/14/2016   CO2 30 11/14/2016   TSH 1.44 11/14/2016   PSA 0.46 02/16/2011   HGBA1C 6.4 04/02/2017   MICROALBUR 3.8 (H) 06/15/2016    Dg Chest 2 View  Result Date: 02/27/2013 *RADIOLOGY REPORT* Clinical Data: History of pipe smoking.  History of hypertension. No chest complaints. CHEST - 2 VIEW Comparison: 02/23/2012. Findings: There is mild cardiac silhouette  enlargement. Ectasia and nonaneurysmal calcification of the thoracic aorta are seen. Mediastinal and hilar contours appear stable. No pleural abnormality is evident.  No active pulmonary infiltrates are seen. No pulmonary masses are evident.  There is narrowing of some of the intervertebral disc spaces.  Multilevel marginal osteophyte formation is seen representing degenerative spondylosis. IMPRESSION: Mild cardiac silhouette enlargement which is slightly larger than on prior study.  No acute or active pulmonary or pleural abnormalities are seen.  Chronic bony findings are described above. Original Report Authenticated By: Shanon Brow Call    Assessment & Plan:   Jdyn was seen today for diabetes and hypertension.  Diagnoses and all orders for this visit:  Type 2 diabetes mellitus with complication, with long-term current use of insulin ([place])- his A1c is down to 6.4% with no medications. Will discontinue all listed medications. I praised his daughter for instituting a strict dietary plan. -     POCT glycosylated hemoglobin (Hb A1C)  Essential hypertension- his blood pressure is well-controlled.  Hyperlipidemia with target LDL less than 100- will discontinue the  statin at his request.   I have discontinued Mr. Lieurance clotrimazole, diazepam, Insulin Glargine, Insulin Pen Needle, simvastatin, glimepiride, metFORMIN, and metoprolol succinate. I am also having him maintain his aspirin, dorzolamide-timolol, latanoprost, pilocarpine, RA VITAMIN D-3, allopurinol, brimonidine, meclizine, ONETOUCH DELICA LANCETS 81R, ONETOUCH VERIO FLEX SYSTEM, colchicine, traMADol, glucose blood, and losartan-hydrochlorothiazide.  No orders of the defined types were placed in this encounter.    Follow-up: Return in about 6 months (around 09/30/2017).  Scarlette Calico, MD

## 2017-04-02 NOTE — Patient Instructions (Signed)

## 2017-04-04 ENCOUNTER — Other Ambulatory Visit: Payer: Self-pay | Admitting: *Deleted

## 2017-04-04 ENCOUNTER — Ambulatory Visit: Payer: Self-pay | Admitting: *Deleted

## 2017-04-04 ENCOUNTER — Encounter: Payer: Self-pay | Admitting: *Deleted

## 2017-04-04 NOTE — Patient Outreach (Signed)
[facility]) Care Management  04/04/2017  Ramirez Fullbright 01/29/1926 [id number]   Call placed to [facility] caregiver/daughter regarding scheduled home visit for this afternoon.  Per chart, member had office visit with primary MD on Monday, A1C now decreased to 6.4.  She report that he had a good visit, no follow up needed for another 6 months.  She state he is not taking any medication for diabetes, but instead being managed with diet.  She or her daughter are checking his blood sugar daily, no concerns.  Daughter feel member is doing well, feel no home visit is needed today since he just had follow up on Monday.  She expresses no further needs from [facility] at this time.  Advised to contact if needs should change.  Will close case, will notify primary MD and care management assistant.  Kemper Durie, [place], MSN [facility] Care Management  [facility] Manager [phone]

## 2017-05-10 ENCOUNTER — Telehealth: Payer: Self-pay

## 2017-05-10 DIAGNOSIS — M1000 Idiopathic gout, unspecified site: Secondary | ICD-10-CM

## 2017-05-10 MED ORDER — ALLOPURINOL 100 MG PO TABS: 100.0000 mg | ORAL_TABLET | Freq: Every day | ORAL | 1 refills | Status: AC

## 2017-05-10 NOTE — Telephone Encounter (Signed)
[facility] Erx sent.

## 2017-05-21 ENCOUNTER — Telehealth: Payer: Self-pay

## 2017-05-21 DIAGNOSIS — E559 Vitamin D deficiency, unspecified: Secondary | ICD-10-CM

## 2017-05-21 MED ORDER — CHOLECALCIFEROL 50 MCG (2000 UT) PO CAPS: 1.0000 | ORAL_CAPSULE | Freq: Every day | ORAL | 3 refills | Status: AC

## 2017-05-21 NOTE — Telephone Encounter (Signed)
[facility] sent rx request for vitamin d. LOV 03/2017. Erx sent 90 and 3

## 2017-06-26 ENCOUNTER — Telehealth: Payer: Self-pay

## 2017-06-26 NOTE — Telephone Encounter (Signed)
rq to refill simvastatin. This medication is no longer on patients medication list. Same faxed back to [facility].

## 2017-06-27 ENCOUNTER — Telehealth: Payer: Self-pay | Admitting: Internal Medicine

## 2017-06-27 NOTE — Telephone Encounter (Signed)
Called and left message for Guy SandiferGina Poli that the medication simvastatin requested by the pharmacy for a refill was discontinued in September, 2018 according to the provider note.

## 2017-06-27 NOTE — Telephone Encounter (Signed)
Copied from CRM [phone]. Topic: Quick Communication - See Telephone Encounter >> Jun 27, 2017 12:55 PM Terisa Starraylor, Brittany L wrote: CRM for notification. See Telephone encounter for:   06/27/17.  Pt's daughter called and said the pharmacy has tried to contact the office several times for a request on SIMVASTATIN 40mg . [facility] [address]. Call back for her is [phone] Guy Sandifer([name] Chretien)

## 2017-07-09 ENCOUNTER — Ambulatory Visit (INDEPENDENT_AMBULATORY_CARE_PROVIDER_SITE_OTHER): Payer: Medicare Other | Admitting: Medical

## 2017-07-09 ENCOUNTER — Other Ambulatory Visit: Payer: Self-pay

## 2017-07-09 ENCOUNTER — Encounter (HOSPITAL_BASED_OUTPATIENT_CLINIC_OR_DEPARTMENT_OTHER): Payer: Self-pay | Admitting: *Deleted

## 2017-07-09 ENCOUNTER — Encounter: Payer: Self-pay | Admitting: Medical

## 2017-07-09 ENCOUNTER — Emergency Department (HOSPITAL_BASED_OUTPATIENT_CLINIC_OR_DEPARTMENT_OTHER)
Admission: EM | Admit: 2017-07-09 | Discharge: 2017-07-09 | Disposition: A | Discharge status: Home / Self Care | Payer: Medicare Other | Attending: Emergency Medicine | Admitting: Emergency Medicine

## 2017-07-09 ENCOUNTER — Telehealth: Payer: Self-pay | Admitting: Medical

## 2017-07-09 VITALS — BP 110/50 | HR 75 | Temp 98.4°F | Resp 16

## 2017-07-09 DIAGNOSIS — R350 Frequency of micturition: Secondary | ICD-10-CM | POA: Insufficient documentation

## 2017-07-09 DIAGNOSIS — I10 Essential (primary) hypertension: Secondary | ICD-10-CM | POA: Insufficient documentation

## 2017-07-09 DIAGNOSIS — R4182 Altered mental status, unspecified: Secondary | ICD-10-CM | POA: Diagnosis not present

## 2017-07-09 DIAGNOSIS — Z7982 Long term (current) use of aspirin: Secondary | ICD-10-CM | POA: Diagnosis not present

## 2017-07-09 DIAGNOSIS — N390 Urinary tract infection, site not specified: Secondary | ICD-10-CM | POA: Diagnosis not present

## 2017-07-09 DIAGNOSIS — Z79899 Other long term (current) drug therapy: Secondary | ICD-10-CM | POA: Diagnosis not present

## 2017-07-09 DIAGNOSIS — R5383 Other fatigue: Secondary | ICD-10-CM | POA: Diagnosis present

## 2017-07-09 DIAGNOSIS — N419 Inflammatory disease of prostate, unspecified: Secondary | ICD-10-CM | POA: Diagnosis not present

## 2017-07-09 DIAGNOSIS — E119 Type 2 diabetes mellitus without complications: Secondary | ICD-10-CM | POA: Diagnosis not present

## 2017-07-09 DIAGNOSIS — F17290 Nicotine dependence, other tobacco product, uncomplicated: Secondary | ICD-10-CM | POA: Insufficient documentation

## 2017-07-09 DIAGNOSIS — R82998 Other abnormal findings in urine: Secondary | ICD-10-CM | POA: Diagnosis not present

## 2017-07-09 LAB — URINALYSIS, MICROSCOPIC (REFLEX)

## 2017-07-09 LAB — URINALYSIS, ROUTINE W REFLEX MICROSCOPIC
Bilirubin Urine: NEGATIVE
Glucose, UA: 100 mg/dL — AB
Ketones, ur: NEGATIVE mg/dL
Nitrite: NEGATIVE
Protein, ur: 30 mg/dL — AB
Specific Gravity, Urine: 1.01 (ref 1.005–1.030)
pH: 6.5 (ref 5.0–8.0)

## 2017-07-09 MED ORDER — CEPHALEXIN 250 MG PO CAPS
500.0000 mg | ORAL_CAPSULE | Freq: Once | ORAL | Status: AC
  Administered 2017-07-09: 500 mg via ORAL
  Filled 2017-07-09: qty 2

## 2017-07-09 MED ORDER — CEPHALEXIN 500 MG PO CAPS: 500.0000 mg | ORAL_CAPSULE | Freq: Three times a day (TID) | ORAL | 0 refills | Status: AC

## 2017-07-09 NOTE — Progress Notes (Signed)
Subjective:    Patient ID: [name], male    DOB: [birth year], 83 y.o.   MRN: [id number]  HPI   Pt in for some frequent urination. Pt sugar this morning was in 100's. He is acting lethargic and last couple of days little bit confused per daughter.   Pt has been urinating a lot all night long.(however he was only able to give [place] 1-2 drops). UA poct done but unable to send out culture.  Pt not known to have uti or prostatitis. Per pt and per daughter.  PSA in 2012 looked normal.  No fever, no chills or sweats.  Recently some mild alternating cva pain. On review, I don't see meds for bph.       Review of Systems  Constitutional: Positive for fatigue. Negative for chills and fever.  Respiratory: Negative for cough, chest tightness, shortness of breath and wheezing.   Cardiovascular: Negative for chest pain and palpitations.  Gastrointestinal: Negative for abdominal pain, constipation, nausea and vomiting.  Genitourinary: Positive for difficulty urinating, flank pain and frequency. Negative for dysuria, penile pain and testicular pain.  Musculoskeletal: Negative for back pain and neck pain.  Skin: Negative for rash.  Neurological: Negative for dizziness, weakness, numbness and headaches.       Some confusion/altered mental status compared to baseline.  Hematological: Negative for adenopathy. Does not bruise/bleed easily.  Psychiatric/Behavioral: Negative for behavioral problems, confusion and sleep disturbance. The patient is not nervous/anxious.    Past Medical History:  Diagnosis Date  . Abnormality of gait   . Angioneurotic edema not elsewhere classified   . Diabetes mellitus without complication ([place])   . Esophageal reflux   . Lumbago   . Osteoarthrosis, unspecified whether generalized or localized, unspecified site   . Overweight(278.02)   . Pure hypercholesterolemia   . Right bundle branch block   . Tobacco use disorder   . Unspecified essential hypertension     . Unspecified glaucoma(365.9)      Social History   Socioeconomic History  . Marital status: Married    Spouse name: ora x 52 yrs  . Number of children: 2  . Years of education: Not on file  . Highest education level: Not on file  Social Needs  . Financial resource strain: Not on file  . Food insecurity - worry: Not on file  . Food insecurity - inability: Not on file  . Transportation needs - medical: Not on file  . Transportation needs - non-medical: Not on file  Occupational History  . Occupation: retired Nurse, mental health  . Smoking status: Current Every Day Smoker    Types: Pipe  . Smokeless tobacco: Never Used  Substance and Sexual Activity  . Alcohol use: No  . Drug use: No  . Sexual activity: Not on file  Other Topics Concern  . Not on file  Social History Narrative  . Not on file    Past Surgical History:  Procedure Laterality Date  . cataract surgery  2004/2005   Dr. Ishmael Holter    Family History  Problem Relation Age of Onset  . Heart disease Father     Allergies  Allergen Reactions  . Iodine     REACTION: rash---whleps  . Dipivefrin Rash  . Methazolamide Rash  . Metoclopramide Rash    Hives    Current Outpatient Medications on File Prior to Visit  Medication Sig Dispense Refill  . allopurinol (ZYLOPRIM) 100 MG tablet Take 1 tablet (100 mg total) by  mouth daily. 90 tablet 1  . aspirin 81 MG tablet Take 81 mg by mouth daily.      . Blood Glucose Monitoring Suppl ([place]) w/Device KIT 1 Device by Does not apply route daily. Use to check blood sugars twice a day 1 kit 0  . brimonidine (ALPHAGAN) 0.2 % ophthalmic solution Place 1 drop into the right eye 3 times daily.    . Cholecalciferol (RA VITAMIN D-3) 2000 units CAPS Take 1 capsule (2,000 Units total) daily by mouth. 90 capsule 3  . colchicine 0.6 MG tablet take 1 tablet by mouth twice a day 180 tablet 3  . dorzolamide-timolol (COSOPT) 22.3-6.8 MG/ML ophthalmic solution  Place 1 drop into both eyes 2 (two) times daily.     Marland Kitchen glucose blood (ONETOUCH VERIO) test strip 1 each by Other route 2 (two) times daily. Use to check blood sugars twice a day 100 each 3  . latanoprost (XALATAN) 0.005 % ophthalmic solution Place 1 drop into both eyes at bedtime.    Marland Kitchen losartan-hydrochlorothiazide (HYZAAR) 50-12.5 MG tablet take 1 tablet by mouth once daily 90 tablet 1  . meclizine (ANTIVERT) 25 MG tablet take 1/2 to 1 tablet by mouth every 4 hours if needed for dizziness 50 tablet 6  . ONETOUCH DELICA LANCETS 63W MISC Use to help check blood sugars twice a day 100 each 3  . pilocarpine (PILOCAR) 1 % ophthalmic solution Place 1 drop into the right eye 3 (three) times daily.    . traMADol (ULTRAM) 50 MG tablet TAKE 1 TABLET BY MOUTH DAILY AS NEEDED AND TAKE 1 TABLET EVERY 6 HOURS IF NEEDED 75 tablet 5   No current facility-administered medications on file prior to visit.     BP (!) 110/50   Pulse 75   Temp 98.4 F (36.9 C) (Oral)   Resp 16   SpO2 100%       Objective:   Physical Exam  General Appearance- Not in acute distress.  HEENT Eyes- Scleraeral/Conjuntiva-bilat- Not Yellow. Mouth & Throat- Normal.  Chest and Lung Exam Auscultation: Breath sounds:-Normal. Adventitious sounds:- No Adventitious sounds.  Cardiovascular Auscultation:Rythm - Regular. Heart Sounds -Normal heart sounds.  Abdomen Inspection:-Inspection Normal.  Palpation/Perucssion: Palpation and Percussion of the abdomen reveal- Non Tender(no suprapubic tenderness), No Rebound tenderness, No rigidity(Guarding) and No Palpable abdominal masses.  Liver:-Normal.  Spleen:- Normal.   Back- mild cva tenderness      Assessment & Plan:  With your recent frequent urination, lethargy confusion and mild CVA region pain over the past week, I do think it would be best for you to be evaluated in the emergency department.  It would be beneficial to get stat labs and hopefully you would be able to give  larger volume of urine to do a urine culture.  Pending on lab results, they could determine if you could be treated as outpatient or if you would need to be admitted.  I have talked with the emergency department MD and made them aware of your recent signs and symptoms.  Follow-up with your PCP or with [place] as determined by ED.

## 2017-07-09 NOTE — ED Triage Notes (Signed)
He was seen at [facility] this am for lethargy, confusion, frequent urination and urinary frequency. He was sent here for urine culture.

## 2017-07-09 NOTE — Patient Instructions (Signed)
With your recent frequent urination, lethargy confusion and mild CVA region pain over the past week, I do think it would be best for you to be evaluated in the emergency department.  It would be beneficial to get stat labs and hopefully you would be able to give larger volume of urine to do a urine culture.  Pending on lab results, they could determine if you could be treated as outpatient or if you would need to be admitted.  I have talked with the emergency department MD and made them aware of your recent signs and symptoms.  Follow-up with your PCP or with us as determined by ED.

## 2017-07-09 NOTE — ED Provider Notes (Signed)
[facility] EMERGENCY DEPARTMENT Provider Note   CSN: [id number] Arrival date & time: 07/09/17  1129     History   Chief Complaint Chief Complaint  Patient presents with  . Fatigue  . Urinary Frequency    HPI [name] is a 83 y.o. male.  HPI   83 year old male presenting with his daughter for evaluation of generalized weakness and frequent urination.  Progressively worsening of the past couple weeks.  He lives independently but he is checked on daily.  His daughter states that he is not really acutely confused but moving much slower than he typically does.  No fevers.  No nausea or vomiting.  He denies any acute pain.  Past Medical History:  Diagnosis Date  . Abnormality of gait   . Angioneurotic edema not elsewhere classified   . Diabetes mellitus without complication ([place])   . Esophageal reflux   . Lumbago   . Osteoarthrosis, unspecified whether generalized or localized, unspecified site   . Overweight(278.02)   . Pure hypercholesterolemia   . Right bundle branch block   . Tobacco use disorder   . Unspecified essential hypertension   . Unspecified glaucoma(365.9)     Patient Active Problem List   Diagnosis Date Noted  . Midline low back pain without sciatica 12/19/2015  . Routine general medical examination at a health care facility 12/19/2015  . Tinea pedis of both feet 12/14/2014  . Vitamin D deficiency 02/12/2014  . Primary gout 02/11/2014  . Type II diabetes mellitus with manifestations ([place]) 02/23/2012  . GLAUCOMA 09/07/2007  . Hyperlipidemia with target LDL less than 100 08/20/2007  . Essential hypertension 08/20/2007  . Osteoarthritis 08/20/2007    Past Surgical History:  Procedure Laterality Date  . cataract surgery  2004/2005   Dr. Ishmael Holter       Home Medications    Prior to Admission medications   Medication Sig Start Date End Date Taking? Authorizing Provider  allopurinol (ZYLOPRIM) 100 MG tablet Take 1 tablet (100 mg  total) by mouth daily. 05/10/17   Janith Lima, MD  aspirin 81 MG tablet Take 81 mg by mouth daily.      [provider]  Blood Glucose Monitoring Suppl ([facility]) w/Device KIT 1 Device by Does not apply route daily. Use to check blood sugars twice a day 01/12/17   Janith Lima, MD  brimonidine [facility]) 0.2 % ophthalmic solution Place 1 drop into the right eye 3 times daily. 03/15/16   [provider]  Cholecalciferol (RA VITAMIN D-3) 2000 units CAPS Take 1 capsule (2,000 Units total) daily by mouth. 05/21/17   Janith Lima, MD  colchicine 0.6 MG tablet take 1 tablet by mouth twice a day 01/14/17   Janith Lima, MD  dorzolamide-timolol (COSOPT) 22.3-6.8 MG/ML ophthalmic solution Place 1 drop into both eyes 2 (two) times daily.     [provider]  glucose blood (ONETOUCH VERIO) test strip 1 each by Other route 2 (two) times daily. Use to check blood sugars twice a day 02/09/17   Janith Lima, MD  latanoprost (XALATAN) 0.005 % ophthalmic solution Place 1 drop into both eyes at bedtime.    [provider]  losartan-hydrochlorothiazide Konrad Penta) 50-12.5 MG tablet take 1 tablet by mouth once daily 02/17/17   Janith Lima, MD  meclizine (ANTIVERT) 25 MG tablet take 1/2 to 1 tablet by mouth every 4 hours if needed for dizziness 08/09/16   Janith Lima, MD  ONETOUCH DELICA LANCETS 81E MISC Use to help check blood sugars twice a day 01/12/17   Janith Lima, MD  pilocarpine (PILOCAR) 1 % ophthalmic solution Place 1 drop into the right eye 3 (three) times daily.    [provider]  traMADol (ULTRAM) 50 MG tablet TAKE 1 TABLET BY MOUTH DAILY AS NEEDED AND TAKE 1 TABLET EVERY 6 HOURS IF NEEDED 01/14/17   Janith Lima, MD    Family History Family History  Problem Relation Age of Onset  . Heart disease Father     Social History Social History   Tobacco Use  . Smoking status: Current Every Day Smoker    Types: Pipe  . Smokeless  tobacco: Never Used  Substance Use Topics  . Alcohol use: No  . Drug use: No     Allergies   Iodine; Dipivefrin; Methazolamide; and Metoclopramide   Review of Systems Review of Systems  All systems reviewed and negative, other than as noted in HPI.  Physical Exam Updated Vital Signs BP 128/69 (BP Location: Right Arm)   Pulse 85   Temp 98.5 F (36.9 C) (Oral)   Resp 18   Ht 5' 6"  (1.676 m)   Wt 86.9 kg (191 lb 9.3 oz)   SpO2 100%   BMI 30.92 kg/m   Physical Exam  Constitutional: He appears well-developed and well-nourished. No distress.  HENT:  Head: Normocephalic and atraumatic.  Eyes: Conjunctivae are normal. Right eye exhibits no discharge. Left eye exhibits no discharge.  Neck: Neck supple.  Cardiovascular: Normal rate, regular rhythm and normal heart sounds. Exam reveals no gallop and no friction rub.  No murmur heard. Pulmonary/Chest: Effort normal and breath sounds normal. No respiratory distress.  Abdominal: Soft. He exhibits no distension. There is no tenderness.  Musculoskeletal: He exhibits no edema or tenderness.  Neurological: He is alert.  Skin: Skin is warm and dry.  Psychiatric: He has a normal mood and affect. His behavior is normal. Thought content normal.  Nursing note and vitals reviewed.    ED Treatments / Results  Labs (all labs ordered are listed, but only abnormal results are displayed) Labs Reviewed  URINALYSIS, ROUTINE W REFLEX MICROSCOPIC - Abnormal; Notable for the following components:      Result Value   APPearance CLOUDY (*)    Glucose, UA 100 (*)    Hgb urine dipstick LARGE (*)    Protein, ur 30 (*)    Leukocytes, UA LARGE (*)    All other components within normal limits  URINALYSIS, MICROSCOPIC (REFLEX) - Abnormal; Notable for the following components:   Bacteria, UA FEW (*)    Squamous Epithelial / LPF 0-5 (*)    All other components within normal limits  URINE CULTURE    EKG  EKG Interpretation None        Radiology No results found.  Procedures Procedures (including critical care time)  Medications Ordered in ED Medications  cephALEXin (KEFLEX) capsule 500 mg (not administered)     Initial Impression / Assessment and Plan / ED Course  I have reviewed the triage vital signs and the nursing notes.  Pertinent labs & imaging results that were available during my care of the patient were reviewed by me and considered in my medical decision making (see chart for details).     83 year old male with increased urinary frequency and generalized fatigue.  UA is consistent with UTI.    Discussed treatment options with him and his daughter.  Patient lives alone,  but he is checked on daily.  They are comfortable with oral antibiotics and discharge at this time.  Discussed that I would like him reevaluated if he does not start to show improvement in the next 24-36 hours and sooner if he worsens or develops other symptoms.   Final Clinical Impressions(s) / ED Diagnoses   Final diagnoses:  Urinary tract infection without hematuria, site unspecified    ED Discharge Orders    None       Virgel Manifold, MD 07/11/17 1614

## 2017-07-09 NOTE — Telephone Encounter (Signed)
Will you result his ua and place order in again. I placed results on your desk. 3+ leukocytes and 3+ blood. I will sign order. Thanks. Took it out of note so I could sign.

## 2017-07-10 LAB — URINE CULTURE

## 2017-07-11 LAB — POCT URINALYSIS DIPSTICK
Bilirubin, UA: NEGATIVE
Glucose, UA: NEGATIVE
Ketones, UA: NEGATIVE
NITRITE UA: NEGATIVE
PH UA: 6.5 (ref 5.0–8.0)
SPEC GRAV UA: 1.01 (ref 1.010–1.025)
Urobilinogen, UA: 1 E.U./dL

## 2017-07-11 NOTE — Addendum Note (Signed)
Addended by: Orlene OchRENCE, Ardra Kuznicki N on: 07/11/2017 07:12 AM   Modules accepted: Orders

## 2017-07-11 NOTE — Telephone Encounter (Signed)
Done

## 2017-07-15 ENCOUNTER — Other Ambulatory Visit: Payer: Self-pay

## 2017-07-15 ENCOUNTER — Inpatient Hospital Stay (HOSPITAL_COMMUNITY)
Admission: EM | Admit: 2017-07-15 | Discharge: 2017-08-10 | DRG: 871 | Disposition: E | Payer: Medicare Other | Attending: Family Medicine | Admitting: Family Medicine

## 2017-07-15 ENCOUNTER — Inpatient Hospital Stay (HOSPITAL_COMMUNITY): Payer: Medicare Other

## 2017-07-15 ENCOUNTER — Encounter (HOSPITAL_COMMUNITY): Payer: Self-pay

## 2017-07-15 ENCOUNTER — Emergency Department (HOSPITAL_COMMUNITY): Payer: Medicare Other

## 2017-07-15 DIAGNOSIS — E785 Hyperlipidemia, unspecified: Secondary | ICD-10-CM | POA: Diagnosis present

## 2017-07-15 DIAGNOSIS — R10819 Abdominal tenderness, unspecified site: Secondary | ICD-10-CM

## 2017-07-15 DIAGNOSIS — M1990 Unspecified osteoarthritis, unspecified site: Secondary | ICD-10-CM | POA: Diagnosis present

## 2017-07-15 DIAGNOSIS — K567 Ileus, unspecified: Secondary | ICD-10-CM | POA: Diagnosis not present

## 2017-07-15 DIAGNOSIS — Z23 Encounter for immunization: Secondary | ICD-10-CM

## 2017-07-15 DIAGNOSIS — Z515 Encounter for palliative care: Secondary | ICD-10-CM | POA: Diagnosis not present

## 2017-07-15 DIAGNOSIS — Z7982 Long term (current) use of aspirin: Secondary | ICD-10-CM

## 2017-07-15 DIAGNOSIS — A419 Sepsis, unspecified organism: Secondary | ICD-10-CM | POA: Diagnosis present

## 2017-07-15 DIAGNOSIS — R4182 Altered mental status, unspecified: Secondary | ICD-10-CM | POA: Diagnosis not present

## 2017-07-15 DIAGNOSIS — I248 Other forms of acute ischemic heart disease: Secondary | ICD-10-CM | POA: Diagnosis present

## 2017-07-15 DIAGNOSIS — N170 Acute kidney failure with tubular necrosis: Secondary | ICD-10-CM | POA: Diagnosis present

## 2017-07-15 DIAGNOSIS — E1151 Type 2 diabetes mellitus with diabetic peripheral angiopathy without gangrene: Secondary | ICD-10-CM | POA: Diagnosis present

## 2017-07-15 DIAGNOSIS — N184 Chronic kidney disease, stage 4 (severe): Secondary | ICD-10-CM | POA: Diagnosis present

## 2017-07-15 DIAGNOSIS — Z91048 Other nonmedicinal substance allergy status: Secondary | ICD-10-CM

## 2017-07-15 DIAGNOSIS — R6520 Severe sepsis without septic shock: Secondary | ICD-10-CM | POA: Diagnosis present

## 2017-07-15 DIAGNOSIS — R404 Transient alteration of awareness: Secondary | ICD-10-CM | POA: Diagnosis not present

## 2017-07-15 DIAGNOSIS — I4510 Unspecified right bundle-branch block: Secondary | ICD-10-CM | POA: Diagnosis present

## 2017-07-15 DIAGNOSIS — D6959 Other secondary thrombocytopenia: Secondary | ICD-10-CM | POA: Diagnosis present

## 2017-07-15 DIAGNOSIS — B49 Unspecified mycosis: Secondary | ICD-10-CM | POA: Diagnosis present

## 2017-07-15 DIAGNOSIS — B379 Candidiasis, unspecified: Secondary | ICD-10-CM | POA: Diagnosis not present

## 2017-07-15 DIAGNOSIS — N138 Other obstructive and reflux uropathy: Secondary | ICD-10-CM | POA: Diagnosis present

## 2017-07-15 DIAGNOSIS — E7800 Pure hypercholesterolemia, unspecified: Secondary | ICD-10-CM | POA: Diagnosis present

## 2017-07-15 DIAGNOSIS — E871 Hypo-osmolality and hyponatremia: Secondary | ICD-10-CM | POA: Diagnosis not present

## 2017-07-15 DIAGNOSIS — J948 Other specified pleural conditions: Secondary | ICD-10-CM | POA: Diagnosis not present

## 2017-07-15 DIAGNOSIS — K219 Gastro-esophageal reflux disease without esophagitis: Secondary | ICD-10-CM | POA: Diagnosis present

## 2017-07-15 DIAGNOSIS — R10817 Generalized abdominal tenderness: Secondary | ICD-10-CM | POA: Diagnosis not present

## 2017-07-15 DIAGNOSIS — E1122 Type 2 diabetes mellitus with diabetic chronic kidney disease: Secondary | ICD-10-CM | POA: Diagnosis present

## 2017-07-15 DIAGNOSIS — Z91041 Radiographic dye allergy status: Secondary | ICD-10-CM | POA: Diagnosis not present

## 2017-07-15 DIAGNOSIS — D696 Thrombocytopenia, unspecified: Secondary | ICD-10-CM | POA: Diagnosis not present

## 2017-07-15 DIAGNOSIS — B3749 Other urogenital candidiasis: Secondary | ICD-10-CM | POA: Diagnosis present

## 2017-07-15 DIAGNOSIS — N3001 Acute cystitis with hematuria: Secondary | ICD-10-CM | POA: Diagnosis not present

## 2017-07-15 DIAGNOSIS — E1165 Type 2 diabetes mellitus with hyperglycemia: Secondary | ICD-10-CM | POA: Diagnosis present

## 2017-07-15 DIAGNOSIS — Z888 Allergy status to other drugs, medicaments and biological substances status: Secondary | ICD-10-CM

## 2017-07-15 DIAGNOSIS — B377 Candidal sepsis: Principal | ICD-10-CM | POA: Diagnosis present

## 2017-07-15 DIAGNOSIS — H409 Unspecified glaucoma: Secondary | ICD-10-CM | POA: Diagnosis present

## 2017-07-15 DIAGNOSIS — R531 Weakness: Secondary | ICD-10-CM | POA: Diagnosis not present

## 2017-07-15 DIAGNOSIS — I959 Hypotension, unspecified: Secondary | ICD-10-CM | POA: Diagnosis not present

## 2017-07-15 DIAGNOSIS — Z8249 Family history of ischemic heart disease and other diseases of the circulatory system: Secondary | ICD-10-CM | POA: Diagnosis not present

## 2017-07-15 DIAGNOSIS — M109 Gout, unspecified: Secondary | ICD-10-CM | POA: Diagnosis present

## 2017-07-15 DIAGNOSIS — N179 Acute kidney failure, unspecified: Secondary | ICD-10-CM | POA: Diagnosis not present

## 2017-07-15 DIAGNOSIS — E118 Type 2 diabetes mellitus with unspecified complications: Secondary | ICD-10-CM | POA: Diagnosis not present

## 2017-07-15 DIAGNOSIS — I129 Hypertensive chronic kidney disease with stage 1 through stage 4 chronic kidney disease, or unspecified chronic kidney disease: Secondary | ICD-10-CM | POA: Diagnosis present

## 2017-07-15 DIAGNOSIS — G9341 Metabolic encephalopathy: Secondary | ICD-10-CM | POA: Diagnosis present

## 2017-07-15 DIAGNOSIS — F17290 Nicotine dependence, other tobacco product, uncomplicated: Secondary | ICD-10-CM | POA: Diagnosis present

## 2017-07-15 DIAGNOSIS — E860 Dehydration: Secondary | ICD-10-CM | POA: Diagnosis present

## 2017-07-15 DIAGNOSIS — N390 Urinary tract infection, site not specified: Secondary | ICD-10-CM

## 2017-07-15 DIAGNOSIS — Z66 Do not resuscitate: Secondary | ICD-10-CM | POA: Diagnosis present

## 2017-07-15 DIAGNOSIS — E875 Hyperkalemia: Secondary | ICD-10-CM | POA: Diagnosis not present

## 2017-07-15 DIAGNOSIS — D649 Anemia, unspecified: Secondary | ICD-10-CM

## 2017-07-15 DIAGNOSIS — N289 Disorder of kidney and ureter, unspecified: Secondary | ICD-10-CM | POA: Diagnosis not present

## 2017-07-15 DIAGNOSIS — Z7189 Other specified counseling: Secondary | ICD-10-CM

## 2017-07-15 DIAGNOSIS — I9589 Other hypotension: Secondary | ICD-10-CM | POA: Diagnosis not present

## 2017-07-15 LAB — I-STAT CHEM 8, ED
BUN: 62 mg/dL — AB (ref 6–20)
CHLORIDE: 100 mmol/L — AB (ref 101–111)
CREATININE: 3.8 mg/dL — AB (ref 0.61–1.24)
Calcium, Ion: 1.1 mmol/L — ABNORMAL LOW (ref 1.15–1.40)
GLUCOSE: 210 mg/dL — AB (ref 65–99)
HCT: 38 % — ABNORMAL LOW (ref 39.0–52.0)
Hemoglobin: 12.9 g/dL — ABNORMAL LOW (ref 13.0–17.0)
POTASSIUM: 5 mmol/L (ref 3.5–5.1)
Sodium: 133 mmol/L — ABNORMAL LOW (ref 135–145)
TCO2: 21 mmol/L — ABNORMAL LOW (ref 22–32)

## 2017-07-15 LAB — URINALYSIS, ROUTINE W REFLEX MICROSCOPIC
BILIRUBIN URINE: NEGATIVE
GLUCOSE, UA: 50 mg/dL — AB
Ketones, ur: NEGATIVE mg/dL
NITRITE: NEGATIVE
PH: 5 (ref 5.0–8.0)
PROTEIN: 100 mg/dL — AB
Specific Gravity, Urine: 1.011 (ref 1.005–1.030)

## 2017-07-15 LAB — I-STAT CG4 LACTIC ACID, ED
LACTIC ACID, VENOUS: 5.26 mmol/L — AB (ref 0.5–1.9)
Lactic Acid, Venous: 5.63 mmol/L (ref 0.5–1.9)

## 2017-07-15 LAB — CBC
HEMATOCRIT: 33.7 % — AB (ref 39.0–52.0)
HEMOGLOBIN: 12.3 g/dL — AB (ref 13.0–17.0)
MCH: 36.9 pg — ABNORMAL HIGH (ref 26.0–34.0)
MCHC: 36.5 g/dL — AB (ref 30.0–36.0)
MCV: 101.2 fL — AB (ref 78.0–100.0)
Platelets: 122 10*3/uL — ABNORMAL LOW (ref 150–400)
RBC: 3.33 MIL/uL — ABNORMAL LOW (ref 4.22–5.81)
RDW: 16.6 % — AB (ref 11.5–15.5)
WBC: 16.4 10*3/uL — ABNORMAL HIGH (ref 4.0–10.5)

## 2017-07-15 LAB — LIPASE, BLOOD: Lipase: 41 U/L (ref 11–51)

## 2017-07-15 LAB — CBG MONITORING, ED: Glucose-Capillary: 213 mg/dL — ABNORMAL HIGH (ref 65–99)

## 2017-07-15 LAB — HEPATIC FUNCTION PANEL
ALBUMIN: 2.4 g/dL — AB (ref 3.5–5.0)
ALK PHOS: 136 U/L — AB (ref 38–126)
ALT: 35 U/L (ref 17–63)
AST: 67 U/L — AB (ref 15–41)
BILIRUBIN TOTAL: 4.2 mg/dL — AB (ref 0.3–1.2)
Bilirubin, Direct: 1.7 mg/dL — ABNORMAL HIGH (ref 0.1–0.5)
Indirect Bilirubin: 2.5 mg/dL — ABNORMAL HIGH (ref 0.3–0.9)
TOTAL PROTEIN: 8.4 g/dL — AB (ref 6.5–8.1)

## 2017-07-15 LAB — I-STAT TROPONIN, ED: Troponin i, poc: 0.04 ng/mL (ref 0.00–0.08)

## 2017-07-15 MED ORDER — SODIUM CHLORIDE 0.9 % IV BOLUS (SEPSIS)
1000.0000 mL | Freq: Once | INTRAVENOUS | Status: AC
  Administered 2017-07-15: 1000 mL via INTRAVENOUS

## 2017-07-15 MED ORDER — PIPERACILLIN-TAZOBACTAM 3.375 G IVPB 30 MIN
3.3750 g | Freq: Once | INTRAVENOUS | Status: AC
  Administered 2017-07-15: 3.375 g via INTRAVENOUS
  Filled 2017-07-15: qty 50

## 2017-07-15 NOTE — ED Notes (Signed)
Pt unable to urinate into urinal for specimen. RN notified.

## 2017-07-15 NOTE — ED Notes (Signed)
Pt unable to stand to complete orthostatic vital signs  

## 2017-07-15 NOTE — ED Provider Notes (Signed)
[facility] DEPT Provider Note   CSN: [id number] Arrival date & time: 07/23/2017  1732     History   Chief Complaint Chief Complaint  Patient presents with  . Weakness    HPI [name] is a 84 y.o. male history diabetes, hypertension here presenting with weakness, near syncope.  Patient states that he was seen in the ED about a week ago and was given Keflex for UTI.  Today he is on his last dose of Keflex.  He lives at home by himself and he was walking and felt weak.  His son-in-law was there and was able to lowered him down to the floor and he did not have any head injury at that time.  Denies any slurred speech or seizure-like activity.  States that he felt like he was on pass out but did not pass out.  Patient has been eating and drinking less than usual. EMS noted that he was hypotensive to the 90s.    The history is provided by the patient.    Past Medical History:  Diagnosis Date  . Abnormality of gait   . Angioneurotic edema not elsewhere classified   . Diabetes mellitus without complication ([place])   . Esophageal reflux   . Lumbago   . Osteoarthrosis, unspecified whether generalized or localized, unspecified site   . Overweight(278.02)   . Pure hypercholesterolemia   . Right bundle branch block   . Tobacco use disorder   . Unspecified essential hypertension   . Unspecified glaucoma(365.9)     Patient Active Problem List   Diagnosis Date Noted  . Midline low back pain without sciatica 12/19/2015  . Routine general medical examination at a health care facility 12/19/2015  . Tinea pedis of both feet 12/14/2014  . Vitamin D deficiency 02/12/2014  . Primary gout 02/11/2014  . Type II diabetes mellitus with manifestations ([place]) 02/23/2012  . GLAUCOMA 09/07/2007  . Hyperlipidemia with target LDL less than 100 08/20/2007  . Essential hypertension 08/20/2007  . Osteoarthritis 08/20/2007    Past Surgical History:  Procedure Laterality  Date  . cataract surgery  2004/2005   Dr. Ishmael Holter       Home Medications    Prior to Admission medications   Medication Sig Start Date End Date Taking? Authorizing Provider  allopurinol (ZYLOPRIM) 100 MG tablet Take 1 tablet (100 mg total) by mouth daily. 05/10/17   Janith Lima, MD  aspirin 81 MG tablet Take 81 mg by mouth daily.      [provider]  Blood Glucose Monitoring Suppl ([facility]) w/Device KIT 1 Device by Does not apply route daily. Use to check blood sugars twice a day 01/12/17   Janith Lima, MD  brimonidine [facility]) 0.2 % ophthalmic solution Place 1 drop into the right eye 3 times daily. 03/15/16   [provider]  cephALEXin (KEFLEX) 500 MG capsule Take 1 capsule (500 mg total) by mouth 3 (three) times daily. 07/09/17   Virgel Manifold, MD  Cholecalciferol (RA VITAMIN D-3) 2000 units CAPS Take 1 capsule (2,000 Units total) daily by mouth. 05/21/17   Janith Lima, MD  colchicine 0.6 MG tablet take 1 tablet by mouth twice a day 01/14/17   Janith Lima, MD  dorzolamide-timolol (COSOPT) 22.3-6.8 MG/ML ophthalmic solution Place 1 drop into both eyes 2 (two) times daily.     [provider]  glucose blood (ONETOUCH VERIO) test strip 1 each by Other route 2 (two) times  daily. Use to check blood sugars twice a day 02/09/17   Janith Lima, MD  latanoprost (XALATAN) 0.005 % ophthalmic solution Place 1 drop into both eyes at bedtime.    [provider]  losartan-hydrochlorothiazide Konrad Penta) 50-12.5 MG tablet take 1 tablet by mouth once daily 02/17/17   Janith Lima, MD  meclizine (ANTIVERT) 25 MG tablet take 1/2 to 1 tablet by mouth every 4 hours if needed for dizziness 08/09/16   Janith Lima, MD  [facility] DELICA LANCETS 38V MISC Use to help check blood sugars twice a day 01/12/17   Janith Lima, MD  pilocarpine (PILOCAR) 1 % ophthalmic solution Place 1 drop into the right eye 3 (three) times daily.    [provider]  traMADol (ULTRAM) 50 MG tablet TAKE 1 TABLET BY MOUTH DAILY AS NEEDED AND TAKE 1 TABLET EVERY 6 HOURS IF NEEDED 01/14/17   Janith Lima, MD    Family History Family History  Problem Relation Age of Onset  . Heart disease Father     Social History Social History   Tobacco Use  . Smoking status: Current Every Day Smoker    Types: Pipe  . Smokeless tobacco: Never Used  Substance Use Topics  . Alcohol use: No  . Drug use: No     Allergies   Iodine; Dipivefrin; Methazolamide; and Metoclopramide   Review of Systems Review of Systems  Neurological: Positive for weakness.  All other systems reviewed and are negative.    Physical Exam Updated Vital Signs BP 95/82 (BP Location: Left Arm)   Pulse 90   Temp 98.7 F (37.1 C) (Oral)   Resp 14   SpO2 100%   Physical Exam  Constitutional: He is oriented to person, place, and time.  Chronically ill, dehydrated   HENT:  Head: Normocephalic.  MM dry   Eyes: Conjunctivae and EOM are normal. Pupils are equal, round, and reactive to light.  Neck: Normal range of motion. Neck supple.  Cardiovascular: Normal rate, regular rhythm and normal heart sounds.  Pulmonary/Chest: Effort normal and breath sounds normal. No stridor. No respiratory distress. He has no wheezes.  Abdominal: Soft. Bowel sounds are normal. He exhibits no distension. There is no tenderness. There is no guarding.  Musculoskeletal: Normal range of motion.  Neurological: He is alert and oriented to person, place, and time.  CN 2-12 intact. Strength 4/5 throughout. Unable to get up to ambulate   Skin: Skin is warm.  Psychiatric: He has a normal mood and affect.  Nursing note and vitals reviewed.    ED Treatments / Results  Labs (all labs ordered are listed, but only abnormal results are displayed) Labs Reviewed  CBC - Abnormal; Notable for the following components:      Result Value   WBC 16.4 (*)    RBC 3.33 (*)    Hemoglobin 12.3 (*)     HCT 33.7 (*)    MCV 101.2 (*)    MCH 36.9 (*)    MCHC 36.5 (*)    RDW 16.6 (*)    Platelets 122 (*)    All other components within normal limits  URINALYSIS, ROUTINE W REFLEX MICROSCOPIC - Abnormal; Notable for the following components:   APPearance TURBID (*)    Glucose, UA 50 (*)    Hgb urine dipstick LARGE (*)    Protein, ur 100 (*)    Leukocytes, UA LARGE (*)    Bacteria, UA RARE (*)    Squamous Epithelial /  LPF 0-5 (*)    Non Squamous Epithelial 0-5 (*)    All other components within normal limits  CBG MONITORING, ED - Abnormal; Notable for the following components:   Glucose-Capillary 213 (*)    All other components within normal limits  CULTURE, BLOOD (ROUTINE X 2)  CULTURE, BLOOD (ROUTINE X 2)  URINE CULTURE  HEPATIC FUNCTION PANEL  LIPASE, BLOOD  BASIC METABOLIC PANEL  I-STAT CG4 LACTIC ACID, ED  I-STAT TROPONIN, ED  I-STAT CHEM 8, ED  I-STAT CG4 LACTIC ACID, ED    EKG  EKG Interpretation  Date/Time:  Sunday July 15 2017 18:23:30 EST Ventricular Rate:  94 PR Interval:    QRS Duration: 143 QT Interval:  398 QTC Calculation: 498 R Axis:   -40 Text Interpretation:  Sinus rhythm Ventricular trigeminy Probable left atrial enlargement RBBB and LAFB Left ventricular hypertrophy Lateral infarct, age indeterminate PVC new since previous  Confirmed by Wandra Arthurs ([id number]) on 07/25/2017 6:51:41 PM       Radiology Dg Chest Port 1 View  Result Date: 07/10/2017 CLINICAL DATA:  Pt has been getting weaker and weaker recently. Pt was not able to stand under his own power today. Pt taking HTN meds. Pt is diabetic. Nonsmoker. Pt has a hx of smoking pipes and cigars. EXAM: PORTABLE CHEST 1 VIEW COMPARISON:  02/27/2013 FINDINGS: Heart is enlarged. Shallow lung inflation. There are no focal consolidations or pleural effusions. No pulmonary edema. Chronic changes in both shoulders. IMPRESSION: Shallow lung inflation.  No evidence for acute  abnormality. Electronically Signed   By:  Nolon Nations M.D.   On: 07/13/2017 20:09    Procedures Procedures (including critical care time)  Medications Ordered in ED Medications  piperacillin-tazobactam (ZOSYN) IVPB 3.375 g (not administered)  sodium chloride 0.9 % bolus 1,000 mL (1,000 mLs Intravenous New Bag/Given 07/23/2017 1900)     Initial Impression / Assessment and Plan / ED Course  I have reviewed the triage vital signs and the nursing notes.  Pertinent labs & imaging results that were available during my care of the patient were reviewed by me and considered in my medical decision making (see chart for details).    Teshaun Olarte is a 84 y.o. male here with near syncope, weakness, possible UTI. On last day of keflex but weak today. Unable to perform orthostatic due to weakness. BP was 80s per EMS. Sepsis protocol initiated.   9:31 PM WBC 17. Lactate 5.6. UA + UTI. Previous urine culture showed multiple species so unable to get sensitivies. He meets SIRS criteria and has UTI as the source. He also has AKI with Cr 3.8. He just finished keflex so I ordered zosyn. Hospitalist to admit.   Final Clinical Impressions(s) / ED Diagnoses   Final diagnoses:  None    ED Discharge Orders    None       Drenda Freeze, MD 08/09/2017 2132

## 2017-07-15 NOTE — ED Notes (Signed)
Spoke with lab, stated they would obtain urine sample from urine that is already in lab.

## 2017-07-15 NOTE — H&P (Signed)
TRH H&P   Patient Demographics:    [name], is a 84 y.o. male  MRN: [id number]   DOB - 07-12-1925  Admit Date - 07/25/2017  Outpatient Primary MD for the patient is Janith Lima, MD  Referring MD/NP/PA: Shirlyn Goltz  Outpatient Specialists:   Patient coming from: home  Chief Complaint  Patient presents with  . Weakness      HPI:    [name]  is a 84 y.o. male, w dm2, hypertension, gout, apparently presents with generalized weakness.  Pt has had recent uti, tx with keflex.  Pt c/o burning with urination as well as frequency of urination esp at nite  Pt notes that he is still making urine.  Denies fever, chills, cp, palp ,cough, sob, n/v, diarrhea, brbpr, black stool.  Pt denies NSAIDS, and iv contrast exposure.  Pt has had decrease in appetite and poor po intake according to his family.  Pt apparently fell coming out of the bathroom, witnessed. No complaints of pain. Slowing went down along the wall.  Pt brought to ED for evaluation.   Pt was brought by EMS, sbp 80's.  In ED.   CXR   IMPRESSION: Shallow lung inflation.  No evidence for acute  abnormality.  Wbc 16.4, hgb 12.3, Plt 122 Urinalysis wbc TNTC Lactic acid 5.63 Na 133, K 5.0,  Bun 62, Creatinine 3.80 Glucose 210  Blood culture x2 pending Trop 0.04  Pt will be admitted for sepsis, secondary to UTI, as well as Acute renal failure.     Review of systems:    In addition to the HPI above, No Fever-chills, No Headache, No changes with Vision or hearing, No problems swallowing food or Liquids, No Chest pain, Cough or Shortness of Breath, No Abdominal pain, No Nausea or Vommitting, Bowel movements are regular, No Blood in stool or Urine, No dysuria, No new skin rashes or bruises, No new joints pains-aches,  No new weakness, tingling, numbness in any extremity, No recent weight gain or loss, No  polyuria, polydypsia or polyphagia, No significant Mental Stressors.  A full 10 point Review of Systems was done, except as stated above, all other Review of Systems were negative.   With Past History of the following :    Past Medical History:  Diagnosis Date  . Abnormality of gait   . Angioneurotic edema not elsewhere classified   . Diabetes mellitus without complication ([place])   . Esophageal reflux   . Lumbago   . Osteoarthrosis, unspecified whether generalized or localized, unspecified site   . Overweight(278.02)   . Pure hypercholesterolemia   . Right bundle branch block   . Tobacco use disorder   . Unspecified essential hypertension   . Unspecified glaucoma(365.9)       Past Surgical History:  Procedure Laterality Date  . cataract surgery  2004/2005   Dr. Ishmael Holter  Social History:     Social History   Tobacco Use  . Smoking status: Current Every Day Smoker    Types: Pipe  . Smokeless tobacco: Never Used  Substance Use Topics  . Alcohol use: No     Lives - at home  Mobility - walks by self w assistance   Family History :     Family History  Problem Relation Age of Onset  . Heart disease Father       Home Medications:   Prior to Admission medications   Medication Sig Start Date End Date Taking? Authorizing Provider  allopurinol (ZYLOPRIM) 100 MG tablet Take 1 tablet (100 mg total) by mouth daily. 05/10/17   Janith Lima, MD  aspirin 81 MG tablet Take 81 mg by mouth daily.      [provider]  Blood Glucose Monitoring Suppl ([place]) w/Device KIT 1 Device by Does not apply route daily. Use to check blood sugars twice a day 01/12/17   Janith Lima, MD  brimonidine [facility]) 0.2 % ophthalmic solution Place 1 drop into the right eye 3 times daily. 03/15/16   [provider]  cephALEXin (KEFLEX) 500 MG capsule Take 1 capsule (500 mg total) by mouth 3 (three) times daily. 07/09/17   Virgel Manifold, MD    Cholecalciferol (RA VITAMIN D-3) 2000 units CAPS Take 1 capsule (2,000 Units total) daily by mouth. 05/21/17   Janith Lima, MD  colchicine 0.6 MG tablet take 1 tablet by mouth twice a day 01/14/17   Janith Lima, MD  dorzolamide-timolol (COSOPT) 22.3-6.8 MG/ML ophthalmic solution Place 1 drop into both eyes 2 (two) times daily.     [provider]  glucose blood (ONETOUCH VERIO) test strip 1 each by Other route 2 (two) times daily. Use to check blood sugars twice a day 02/09/17   Janith Lima, MD  latanoprost (XALATAN) 0.005 % ophthalmic solution Place 1 drop into both eyes at bedtime.    [provider]  losartan-hydrochlorothiazide Konrad Penta) 50-12.5 MG tablet take 1 tablet by mouth once daily 02/17/17   Janith Lima, MD  meclizine (ANTIVERT) 25 MG tablet take 1/2 to 1 tablet by mouth every 4 hours if needed for dizziness 08/09/16   Janith Lima, MD  [facility] DELICA LANCETS 08Q MISC Use to help check blood sugars twice a day 01/12/17   Janith Lima, MD  pilocarpine (PILOCAR) 1 % ophthalmic solution Place 1 drop into the right eye 3 (three) times daily.    [provider]  traMADol (ULTRAM) 50 MG tablet TAKE 1 TABLET BY MOUTH DAILY AS NEEDED AND TAKE 1 TABLET EVERY 6 HOURS IF NEEDED 01/14/17   Janith Lima, MD     Allergies:     Allergies  Allergen Reactions  . Iodine     REACTION: rash---whleps  . Dipivefrin Rash  . Methazolamide Rash  . Metoclopramide Rash    Hives     Physical Exam:   Vitals  Blood pressure 134/78, pulse 100, temperature 98.7 F (37.1 C), temperature source Oral, resp. rate 16, SpO2 100 %.   1. General lying in bed in NAD,    2. Normal affect and insight, Not Suicidal or Homicidal, Awake Alert, Oriented X 3.  3. No F.N deficits, ALL C.Nerves Intact, Strength 5/5 all 4 extremities, Sensation intact all 4 extremities, Plantars down going.  4. Ears and Eyes appear Normal, Conjunctivae clear, PERRLA. Moist Oral  Mucosa.  5.  Supple Neck, No JVD, No cervical lymphadenopathy appriciated, No Carotid Bruits.  6. Symmetrical Chest wall movement, Good air movement bilaterally, CTAB.  7. RRR, No Gallops, Rubs or Murmurs, No Parasternal Heave.  8. Positive Bowel Sounds, Abdomen Soft, No tenderness, No organomegaly appriciated,No rebound -guarding or rigidity.  9.  No Cyanosis, Normal Skin Turgor, No Skin Rash or Bruise.  10. Good muscle tone,  joints appear normal , no effusions, Normal ROM.  11. No Palpable Lymph Nodes in Neck or Axillae  ? Poor vision   Data Review:    CBC Recent Labs  Lab 07/27/2017 1816 07/11/2017 2054  WBC 16.4*  --   HGB 12.3* 12.9*  HCT 33.7* 38.0*  PLT 122*  --   MCV 101.2*  --   MCH 36.9*  --   MCHC 36.5*  --   RDW 16.6*  --    ------------------------------------------------------------------------------------------------------------------  Chemistries  Recent Labs  Lab 08/01/2017 2054  NA 133*  K 5.0  CL 100*  GLUCOSE 210*  BUN 62*  CREATININE 3.80*   ------------------------------------------------------------------------------------------------------------------ estimated creatinine clearance is 13.6 mL/min (A) (by C-G formula based on SCr of 3.8 mg/dL (H)). ------------------------------------------------------------------------------------------------------------------ No results for input(s): TSH, T4TOTAL, T3FREE, THYROIDAB in the last 72 hours.  Invalid input(s): FREET3  Coagulation profile No results for input(s): INR, PROTIME in the last 168 hours. ------------------------------------------------------------------------------------------------------------------- No results for input(s): DDIMER in the last 72 hours. -------------------------------------------------------------------------------------------------------------------  Cardiac Enzymes No results for input(s): CKMB, TROPONINI, MYOGLOBIN in the last 168 hours.  Invalid input(s):  CK ------------------------------------------------------------------------------------------------------------------ No results found for: BNP   ---------------------------------------------------------------------------------------------------------------  Urinalysis    Component Value Date/Time   COLORURINE YELLOW 08/01/2017 1816   APPEARANCEUR TURBID (A) 08/07/2017 1816   LABSPEC 1.011 07/21/2017 1816   PHURINE 5.0 07/31/2017 1816   GLUCOSEU 50 (A) 07/26/2017 1816   GLUCOSEU NEGATIVE 06/15/2015 1341   HGBUR LARGE (A) 07/26/2017 1816   BILIRUBINUR NEGATIVE 07/28/2017 1816   BILIRUBINUR Negative 07/11/2017 0710   KETONESUR NEGATIVE 07/19/2017 1816   PROTEINUR 100 (A) 08/01/2017 1816   UROBILINOGEN 1.0 07/11/2017 0710   UROBILINOGEN 0.2 06/15/2015 1341   NITRITE NEGATIVE 07/19/2017 1816   LEUKOCYTESUR LARGE (A) 08/04/2017 1816    ----------------------------------------------------------------------------------------------------------------   Imaging Results:    Dg Chest Port 1 View  Result Date: 08/04/2017 CLINICAL DATA:  Pt has been getting weaker and weaker recently. Pt was not able to stand under his own power today. Pt taking HTN meds. Pt is diabetic. Nonsmoker. Pt has a hx of smoking pipes and cigars. EXAM: PORTABLE CHEST 1 VIEW COMPARISON:  02/27/2013 FINDINGS: Heart is enlarged. Shallow lung inflation. There are no focal consolidations or pleural effusions. No pulmonary edema. Chronic changes in both shoulders. IMPRESSION: Shallow lung inflation.  No evidence for acute  abnormality. Electronically Signed   By: Nolon Nations M.D.   On: 07/23/2017 20:09      Assessment & Plan:    Principal Problem:   Sepsis ([place]) Active Problems:   Type II diabetes mellitus with manifestations ([place])   Anemia   UTI (urinary tract infection)   ARF (acute renal failure) (HCC)   Thrombocytopenia (HCC)   Hyponatremia    Sepsis (Leukocytosis, LA elevation, hypotension, UTI,  ARF) Blood culture x2 pending Urine culture pending Start Rocephin 1gm iv qday  UTI Awaiting urine culture Start Rocephin 1gm iv qday  Acute renal failure Check urine sodium, urine creatinine, urine eosinophils Check renal ultrasound STOP Losartan/hydrochlorothiazide Hydrate with ns iv May need Foley Check cmp  in am  Hypotension Tele Trop I q6h x3 Check echo  Anemia/ Thrombocytopenia Check LDH, if high consider TTP vs DIC Check cbc in am  Dm2 fsbs ac and qhs, ISS   Gout Cont allopurinol Cont tramadol Decrease colchicine to 0.33m po qday  Glaucoma Cont eye Gtt   DVT Prophylaxis Heparin - (monitor plt closely)- SCDs   AM Labs Ordered, also please review Full Orders  Family Communication: Admission, patients condition and plan of care including tests being ordered have been discussed with the patient  who indicate understanding and agree with the plan and Code Status.  Code Status FULL CODE  Likely DC to  TBD  Condition GUARDED    Consults called: none  Admission status: inpatient   Time spent in minutes : 45   JJani GravelM.D on 07/30/2017 at 9:20 PM  Between 7am to 7pm - Pager - [phone] After 7pm go to [email or web address] - password [facility] - Office  [phone]

## 2017-07-15 NOTE — ED Triage Notes (Signed)
Pt arrived via EMS from home. Per EMS received called pt had fall and as per family pt was ambulating with walker and fell back and slid to the floor as pt became weak . Pt family was present and reports no LOC, and pt with ease dropped to the floor. Pt has recently had a UTI, and was on ABX, but pt had some medications still left where as the course should have been complete per pt son/   Per EMS no signs of trauma ., and pt denies any pain  Pt lives alone. Pt son states that he is not sure if pt has been eating and drinking consistently and compliant with medication.   EMS v/s  BP  90/50, HR 100, RR 16, 98% RA , CBG 324    IV 22 left  forearm and was given 400 cc of IV fluid by EMS H/o DM

## 2017-07-16 ENCOUNTER — Inpatient Hospital Stay (HOSPITAL_COMMUNITY): Payer: Medicare Other

## 2017-07-16 DIAGNOSIS — I9589 Other hypotension: Secondary | ICD-10-CM

## 2017-07-16 LAB — COMPREHENSIVE METABOLIC PANEL
ALT: 33 U/L (ref 17–63)
AST: 62 U/L — AB (ref 15–41)
Albumin: 2.1 g/dL — ABNORMAL LOW (ref 3.5–5.0)
Alkaline Phosphatase: 126 U/L (ref 38–126)
Anion gap: 12 (ref 5–15)
BILIRUBIN TOTAL: 3.7 mg/dL — AB (ref 0.3–1.2)
BUN: 82 mg/dL — AB (ref 6–20)
CO2: 16 mmol/L — AB (ref 22–32)
Calcium: 8.3 mg/dL — ABNORMAL LOW (ref 8.9–10.3)
Chloride: 103 mmol/L (ref 101–111)
Creatinine, Ser: 3.95 mg/dL — ABNORMAL HIGH (ref 0.61–1.24)
GFR calc Af Amer: 14 mL/min — ABNORMAL LOW (ref >60)
GFR calc non Af Amer: 12 mL/min — ABNORMAL LOW (ref >60)
Glucose, Bld: 183 mg/dL — ABNORMAL HIGH (ref 65–99)
POTASSIUM: 5 mmol/L (ref 3.5–5.1)
SODIUM: 131 mmol/L — AB (ref 135–145)
TOTAL PROTEIN: 7.1 g/dL (ref 6.5–8.1)

## 2017-07-16 LAB — ECHOCARDIOGRAM COMPLETE
AVLVOTPG: 7 mmHg
CHL CUP DOP CALC LVOT VTI: 22.1 cm
CHL CUP MV DEC (S): 225
EWDT: 225 ms
FS: 38 % (ref 28–44)
Height: 66 in
IVS/LV PW RATIO, ED: 1.16
LA diam end sys: 41 mm
LA vol: 47.5 mL
LADIAMINDEX: 2.01 cm/m2
LASIZE: 41 mm
LAVOLA4C: 48.5 mL
LAVOLIN: 23.2 mL/m2
LV PW d: 13.5 mm — AB (ref 0.6–1.1)
LV dias vol index: 29 mL/m2
LVDIAVOL: 59 mL — AB (ref 62–150)
LVOT area: 2.84 cm2
LVOT diameter: 19 mm
LVOT peak vel: 132 cm/s
LVOTSV: 63 mL
LVSYSVOL: 20 mL — AB
LVSYSVOLIN: 10 mL/m2
MV Peak grad: 5 mmHg
MV pk E vel: 116 m/s
RV TAPSE: 10 mm
Simpson's disk: 67
Stroke v: 40 ml
WEIGHTICAEL: 3079.39 [oz_av]

## 2017-07-16 LAB — GLUCOSE, CAPILLARY
GLUCOSE-CAPILLARY: 172 mg/dL — AB (ref 65–99)
GLUCOSE-CAPILLARY: 151 mg/dL — AB (ref 65–99)
GLUCOSE-CAPILLARY: 208 mg/dL — AB (ref 65–99)
Glucose-Capillary: 162 mg/dL — ABNORMAL HIGH (ref 65–99)

## 2017-07-16 LAB — CBC
HEMATOCRIT: 32.7 % — AB (ref 39.0–52.0)
HEMOGLOBIN: 11.8 g/dL — AB (ref 13.0–17.0)
MCH: 36.3 pg — ABNORMAL HIGH (ref 26.0–34.0)
MCHC: 36.1 g/dL — AB (ref 30.0–36.0)
MCV: 100.6 fL — AB (ref 78.0–100.0)
Platelets: 114 10*3/uL — ABNORMAL LOW (ref 150–400)
RBC: 3.25 MIL/uL — ABNORMAL LOW (ref 4.22–5.81)
RDW: 16.6 % — AB (ref 11.5–15.5)
WBC: 20.4 10*3/uL — ABNORMAL HIGH (ref 4.0–10.5)

## 2017-07-16 LAB — TROPONIN I
TROPONIN I: 0.05 ng/mL — AB (ref <0.03)
Troponin I: 0.05 ng/mL (ref <0.03)
Troponin I: 0.06 ng/mL (ref <0.03)

## 2017-07-16 LAB — HEMOGLOBIN A1C
Hgb A1c MFr Bld: 8.5 % — ABNORMAL HIGH (ref 4.8–5.6)
MEAN PLASMA GLUCOSE: 197.25 mg/dL

## 2017-07-16 LAB — CREATININE, SERUM
Creatinine, Ser: 3.71 mg/dL — ABNORMAL HIGH (ref 0.61–1.24)
GFR calc Af Amer: 15 mL/min — ABNORMAL LOW (ref >60)
GFR, EST NON AFRICAN AMERICAN: 13 mL/min — AB (ref >60)

## 2017-07-16 LAB — LACTIC ACID, PLASMA: LACTIC ACID, VENOUS: 5.8 mmol/L — AB (ref 0.5–1.9)

## 2017-07-16 LAB — SODIUM, URINE, RANDOM: Sodium, Ur: 73 mmol/L

## 2017-07-16 LAB — MRSA PCR SCREENING: MRSA by PCR: NEGATIVE

## 2017-07-16 MED ORDER — BRIMONIDINE TARTRATE 0.2 % OP SOLN
1.0000 [drp] | Freq: Three times a day (TID) | OPHTHALMIC | Status: DC
  Administered 2017-07-16 – 2017-07-21 (×16): 1 [drp] via OPHTHALMIC
  Filled 2017-07-16: qty 5

## 2017-07-16 MED ORDER — DORZOLAMIDE HCL-TIMOLOL MAL 2-0.5 % OP SOLN
1.0000 [drp] | Freq: Two times a day (BID) | OPHTHALMIC | Status: DC
  Administered 2017-07-16 – 2017-07-21 (×11): 1 [drp] via OPHTHALMIC
  Filled 2017-07-16: qty 10

## 2017-07-16 MED ORDER — ALLOPURINOL 100 MG PO TABS
100.0000 mg | ORAL_TABLET | Freq: Every day | ORAL | Status: DC
  Administered 2017-07-16 – 2017-07-17 (×2): 100 mg via ORAL
  Filled 2017-07-16 (×2): qty 1

## 2017-07-16 MED ORDER — SODIUM CHLORIDE 0.9 % IV SOLN
INTRAVENOUS | Status: AC
  Administered 2017-07-16 (×2): via INTRAVENOUS

## 2017-07-16 MED ORDER — ENSURE ENLIVE PO LIQD: 237.0000 mL | Freq: Two times a day (BID) | ORAL | Status: DC

## 2017-07-16 MED ORDER — ASPIRIN EC 81 MG PO TBEC
81.0000 mg | DELAYED_RELEASE_TABLET | Freq: Every day | ORAL | Status: DC
  Administered 2017-07-16 – 2017-07-17 (×2): 81 mg via ORAL
  Filled 2017-07-16 (×2): qty 1

## 2017-07-16 MED ORDER — TRAMADOL HCL 50 MG PO TABS
50.0000 mg | ORAL_TABLET | Freq: Two times a day (BID) | ORAL | Status: DC | PRN
  Administered 2017-07-17: 50 mg via ORAL
  Filled 2017-07-16: qty 1

## 2017-07-16 MED ORDER — ONDANSETRON HCL 4 MG/2ML IJ SOLN
4.0000 mg | Freq: Four times a day (QID) | INTRAMUSCULAR | Status: DC | PRN
  Administered 2017-07-16: 4 mg via INTRAVENOUS
  Filled 2017-07-16: qty 2

## 2017-07-16 MED ORDER — PILOCARPINE HCL 1 % OP SOLN
1.0000 [drp] | Freq: Three times a day (TID) | OPHTHALMIC | Status: DC
  Administered 2017-07-16 – 2017-07-21 (×14): 1 [drp] via OPHTHALMIC
  Filled 2017-07-16 (×4): qty 15

## 2017-07-16 MED ORDER — ONDANSETRON HCL 4 MG/2ML IJ SOLN: 4.0000 mg | Freq: Four times a day (QID) | INTRAMUSCULAR | Status: DC | PRN

## 2017-07-16 MED ORDER — SODIUM CHLORIDE 0.9 % IV BOLUS (SEPSIS)
1500.0000 mL | Freq: Once | INTRAVENOUS | Status: AC
Start: 2017-07-16 — End: 2017-07-16
  Administered 2017-07-16: 1500 mL via INTRAVENOUS

## 2017-07-16 MED ORDER — INSULIN ASPART 100 UNIT/ML ~~LOC~~ SOLN
0.0000 [IU] | Freq: Every day | SUBCUTANEOUS | Status: DC
  Administered 2017-07-16: 2 [IU] via SUBCUTANEOUS

## 2017-07-16 MED ORDER — PNEUMOCOCCAL VAC POLYVALENT 25 MCG/0.5ML IJ INJ
0.5000 mL | INJECTION | INTRAMUSCULAR | Status: AC
  Administered 2017-07-19: 0.5 mL via INTRAMUSCULAR
  Filled 2017-07-16 (×2): qty 0.5

## 2017-07-16 MED ORDER — LATANOPROST 0.005 % OP SOLN
1.0000 [drp] | Freq: Every day | OPHTHALMIC | Status: DC
  Administered 2017-07-16 – 2017-07-20 (×5): 1 [drp] via OPHTHALMIC
  Filled 2017-07-16: qty 2.5

## 2017-07-16 MED ORDER — INFLUENZA VAC SPLIT HIGH-DOSE 0.5 ML IM SUSY
0.5000 mL | PREFILLED_SYRINGE | INTRAMUSCULAR | Status: AC
  Administered 2017-07-19: 0.5 mL via INTRAMUSCULAR
  Filled 2017-07-16 (×2): qty 0.5

## 2017-07-16 MED ORDER — HEPARIN SODIUM (PORCINE) 5000 UNIT/ML IJ SOLN
5000.0000 [IU] | Freq: Three times a day (TID) | INTRAMUSCULAR | Status: DC
  Administered 2017-07-16: 5000 [IU] via SUBCUTANEOUS
  Filled 2017-07-16: qty 1

## 2017-07-16 MED ORDER — ACETAMINOPHEN 325 MG PO TABS
650.0000 mg | ORAL_TABLET | Freq: Four times a day (QID) | ORAL | Status: DC | PRN
  Administered 2017-07-16: 650 mg via ORAL
  Filled 2017-07-16: qty 2

## 2017-07-16 MED ORDER — ACETAMINOPHEN 650 MG RE SUPP: 650.0000 mg | Freq: Four times a day (QID) | RECTAL | Status: DC | PRN

## 2017-07-16 MED ORDER — INSULIN ASPART 100 UNIT/ML ~~LOC~~ SOLN
0.0000 [IU] | Freq: Three times a day (TID) | SUBCUTANEOUS | Status: DC
  Administered 2017-07-16 (×3): 2 [IU] via SUBCUTANEOUS
  Administered 2017-07-17: 1 [IU] via SUBCUTANEOUS
  Administered 2017-07-17 (×2): 2 [IU] via SUBCUTANEOUS
  Administered 2017-07-18: 1 [IU] via SUBCUTANEOUS

## 2017-07-16 MED ORDER — DEXTROSE 5 % IV SOLN
2.0000 g | INTRAVENOUS | Status: DC
  Administered 2017-07-16 – 2017-07-17 (×2): 2 g via INTRAVENOUS
  Filled 2017-07-16 (×2): qty 2

## 2017-07-16 MED ORDER — TRAMADOL HCL 50 MG PO TABS: 50.0000 mg | ORAL_TABLET | Freq: Four times a day (QID) | ORAL | Status: DC | PRN

## 2017-07-16 MED ORDER — DEXTROSE 5 % IV SOLN
1.0000 g | INTRAVENOUS | Status: DC
  Administered 2017-07-16: 1 g via INTRAVENOUS
  Filled 2017-07-16: qty 10

## 2017-07-16 NOTE — Progress Notes (Signed)
*  PRELIMINARY RESULTS* Echocardiogram 2D Echocardiogram has been performed.  Stacey DrainWhite, Lazer Wollard J 07/16/2017, 4:05 PM

## 2017-07-16 NOTE — Progress Notes (Signed)
CRITICAL VALUE ALERT  Critical Value:  Lactic acid 5.0   Date & Time Notied:  07/16/17  Provider Notified: Yes  Orders Received/Actions taken: Lactic acid trending down. Follow-up lactic acid for tomorrow AM.

## 2017-07-16 NOTE — Progress Notes (Addendum)
Pt transferred to ICU/SD 1241 report given to Denny PeonErin, RN and Donnal DebarMichell, Charity fundraiserN. Pt answers to name and place, daughter at bedside. SRP, RN

## 2017-07-16 NOTE — Progress Notes (Signed)
Received order to transfer patient to ICU/SD. Family updated. SRP, RN

## 2017-07-16 NOTE — Progress Notes (Signed)
PROGRESS NOTE    [name]  YQI:[id number] DOB: [birth year] DOA: 29-Jul-2017 PCP: [name], MD   Brief Narrative: 84 year old male with history of type 2 diabetes, hypertension, gout presented with generalized weakness.  Patient was recently treated with Keflex for UTI.  Patient with urinary incontinence, dysuria urgency.  He lives by himself.  Admitted for UTI, acute kidney injury and sepsis. Examination consistent with bladder distention therefore Foley catheter was inserted on 1/7, drained 2600 cc of urine which was cloudy.  Assessment & Plan:   #Severe sepsis due to lower urinary tract infection: -Increase the dose of ceftriaxone to 2 g.  Patient with worsening leukocytosis and elevated lactic acid level of 5.26.  Has worsening renal failure. -Bolus 1.5 L of normal saline and increase the dose of maintenance fluid 225 cc an hour.  Patient has very dry mucous membrane and poor oral intake. -Follow-up culture results and guide antibiotic treatment. -Patient is very sick.  Continue to monitor for now.  I reviewed this with the patient's daughter at bedside.  #Acute kidney injury likely ATN in the setting of severe sepsis, acute urinary retention, UTI: -Ultrasound of kidneys with no hydronephrosis.  Patient with distended urinary bladder.  Foley catheter was inserted with 2.6 L of urine.  It was cloudy and looks pus as per patient's nurse. -UA with UTI.  On antibiotics and IV fluid. -Monitor BMP, avoid nephrotoxins. -Monitor urine output.  #Hypotension due to sepsis.  On IV fluid.  Monitor blood pressure.  #Elevated troponin likely demand ischemia in the setting of sepsis and reduced GFR.  Check echocardiogram.  Denied chest pain.  #Acute metabolic encephalopathy: Due to sepsis.  Has no focal neurological deficit.  He was following simple commands.  Monitor mental status and provide supportive care.  #Thrombocytopenia: Due to sepsis.  No sign of bleeding.  Monitor labs.  #Type  2 diabetes: Monitor blood sugar level.  Continue sliding scale.  Goals of care discussion: Discussed goals of care with the patient's daughter at bedside.  She will also discussed with patient's son who is arriving to the hospital today. Continue current medical and supportive care.  DVT prophylaxis:SCD.  Discontinue heparin because of thrombocytopenia Code Status: Full code Family Communication: Discussed with the patient's daughter at bedside Disposition Plan: Currently admitted    Consultants:   None  Procedures: Insert Foley catheter on 1/7 Antimicrobials: Ceftriaxone  Subjective: Seen and examined at bedside.  Reported not feeling well.  Denied nausea vomiting chest pain or abdominal pain.  History is unreliable.  Objective: Vitals:   07/16/17 0237 07/16/17 0330 07/16/17 0452 07/16/17 1041  BP: 126/70 135/74 (!) 109/56 (!) 89/38  Pulse: (!) 111 (!) 110 (!) 108 100  Resp: 17 (!) 22 20 18   Temp:   98.9 F (37.2 C) 99.7 F (37.6 C)  TempSrc:   Oral Oral  SpO2: 100% 100% 98% 98%  Weight:   87.3 kg (192 lb 7.4 oz)   Height:   5\' 6"  (1.676 m)     Intake/Output Summary (Last 24 hours) at 07/16/2017 1134 Last data filed at 07/16/2017 1100 Gross per 24 hour  Intake 1000 ml  Output 2800 ml  Net -1800 ml   Filed Weights   07/16/17 0452  Weight: 87.3 kg (192 lb 7.4 oz)    Examination:  General exam: Ill looking male lying on bed, dry mucous membrane Respiratory system: Clear to auscultation. Respiratory effort normal. No wheezing or crackle Cardiovascular system: S1 & S2 heard, RRR.  No pedal edema. Gastrointestinal system: Suprapubic distention but no tenderness.  Bowel sounds positive, Central nervous system: Alert awake and following commands with intermittent confusion. Extremities: Symmetric 5 x 5 power. Skin: No rashes, lesions or ulcers Psychiatry: Judgement and insight appear impaired    Data Reviewed: I have personally reviewed following labs and imaging  studies  CBC: Recent Labs  Lab 04-17-2018 1816 04-17-2018 2054 07/16/17 0725  WBC 16.4*  --  20.4*  HGB 12.3* 12.9* 11.8*  HCT 33.7* 38.0* 32.7*  MCV 101.2*  --  100.6*  PLT 122*  --  114*   Basic Metabolic Panel: Recent Labs  Lab 04-17-2018 2054 07/16/17 0129 07/16/17 0725  NA 133*  --  131*  K 5.0  --  5.0  CL 100*  --  103  CO2  --   --  16*  GLUCOSE 210*  --  183*  BUN 62*  --  82*  CREATININE 3.80* 3.71* 3.95*  CALCIUM  --   --  8.3*   GFR: Estimated Creatinine Clearance: 13.1 mL/min (A) (by C-G formula based on SCr of 3.95 mg/dL (H)). Liver Function Tests: Recent Labs  Lab 04-17-2018 2015 07/16/17 0725  AST 67* 62*  ALT 35 33  ALKPHOS 136* 126  BILITOT 4.2* 3.7*  PROT 8.4* 7.1  ALBUMIN 2.4* 2.1*   Recent Labs  Lab 04-17-2018 2015  LIPASE 41   No results for input(s): AMMONIA in the last 168 hours. Coagulation Profile: No results for input(s): INR, PROTIME in the last 168 hours. Cardiac Enzymes: Recent Labs  Lab 07/16/17 0129 07/16/17 0725  TROPONINI 0.05* 0.05*   BNP (last 3 results) No results for input(s): PROBNP in the last 8760 hours. HbA1C: Recent Labs    07/16/17 0129  HGBA1C 8.5*   CBG: Recent Labs  Lab 04-17-2018 1821 07/16/17 0756  GLUCAP 213* 172*   Lipid Profile: No results for input(s): CHOL, HDL, LDLCALC, TRIG, CHOLHDL, LDLDIRECT in the last 72 hours. Thyroid Function Tests: No results for input(s): TSH, T4TOTAL, FREET4, T3FREE, THYROIDAB in the last 72 hours. Anemia Panel: No results for input(s): VITAMINB12, FOLATE, FERRITIN, TIBC, IRON, RETICCTPCT in the last 72 hours. Sepsis Labs: Recent Labs  Lab 04-17-2018 2054 04-17-2018 2158  LATICACIDVEN 5.63* 5.26*    Recent Results (from the past 240 hour(s))  Urine culture     Status: Abnormal   Collection Time: 07/09/17 12:31 PM  Result Value Ref Range Status   Specimen Description URINE, CLEAN CATCH  Final   Special Requests NONE  Final   Culture MULTIPLE SPECIES PRESENT,  SUGGEST RECOLLECTION (A)  Final   Report Status 07/10/2017 FINAL  Final         Radiology Studies: [place] Renal  Result Date: 08/04/2017 CLINICAL DATA:  Acute renal failure. EXAM: RENAL / URINARY TRACT ULTRASOUND COMPLETE COMPARISON:  None. FINDINGS: Examination is technically limited due to shadowing bowel gas. Right Kidney: Only faintly visualized. Length: 10.1 cm. No obvious hydronephrosis, however significantly limited evaluation. Left Kidney: Only faintly visualized. Length: 10.9 cm. No obvious hydronephrosis, however significantly limited evaluation. Bladder: Appears normal for degree of bladder distention. Bladder volume approximately 15.2 x 16.7 x 21.3 cm (volume = 2830 cm^3). IMPRESSION: 1. Marked urinary bladder distention. 2. Kidneys poorly visualized. No gross hydronephrosis, however limited exam due to shadowing bowel gas obscuring the kidneys. Electronically Signed   By: Rubye OaksMelanie  Ehinger M.D.   On: 2017/10/13 23:42   Dg Chest Port 1 View  Result Date: 08/06/2017 CLINICAL DATA:  Pt has been getting weaker and weaker recently. Pt was not able to stand under his own power today. Pt taking HTN meds. Pt is diabetic. Nonsmoker. Pt has a hx of smoking pipes and cigars. EXAM: PORTABLE CHEST 1 VIEW COMPARISON:  02/27/2013 FINDINGS: Heart is enlarged. Shallow lung inflation. There are no focal consolidations or pleural effusions. No pulmonary edema. Chronic changes in both shoulders. IMPRESSION: Shallow lung inflation.  No evidence for acute  abnormality. Electronically Signed   By: Norva Pavlov M.D.   On: 08/04/2017 20:09        Scheduled Meds: . allopurinol  100 mg Oral Daily  . aspirin EC  81 mg Oral Daily  . brimonidine  1 drop Right Eye TID  . dorzolamide-timolol  1 drop Both Eyes BID  . heparin  5,000 Units Subcutaneous Q8H  . insulin aspart  0-5 Units Subcutaneous QHS  . insulin aspart  0-9 Units Subcutaneous TID WC  . latanoprost  1 drop Both Eyes QHS  . pilocarpine  1 drop  Right Eye TID   Continuous Infusions: . sodium chloride 100 mL/hr at 07/16/17 0305  . cefTRIAXone (ROCEPHIN)  IV       LOS: 1 day    Dron Jaynie Collins, MD [facility] Pager [phone]  If 7PM-7AM, please contact night-coverage [email or web address] Password [facility] 07/16/2017, 11:34 AM

## 2017-07-16 NOTE — Progress Notes (Signed)
PT Cancellation Note  Patient Details Name: [name] MRN: [id number] DOB: 03/11/1926   Cancelled Treatment:    Reason Eval/Treat Not Completed: Medical issues which prohibited therapy RN reports pt septic and has decreased BP.  Pt to be transferred to stepdown unit.  Will check back as schedule permits.   Lyberti Thrush,KATHrine E 07/16/2017, 2:22 PM Zenovia JarredKati Rye Decoste, PT, DPT 07/16/2017 Pager: [phone]

## 2017-07-16 NOTE — Progress Notes (Signed)
LA 5.8 and Troponin 0.06... MD updated. SRP, RN

## 2017-07-16 NOTE — Progress Notes (Addendum)
Pt had foley inserted as ordered pt tol procedure well, foley drained 2700 or amber to pink milky white urine, VS noted, daughter at bedside, IV bolus given as ordered. MD updated. SRP, RN

## 2017-07-16 NOTE — ED Notes (Signed)
No respiratory or acute distress noted alert and oriented no reaction to medication noted call light in reach. 

## 2017-07-17 LAB — BLOOD CULTURE ID PANEL (REFLEXED)
Acinetobacter baumannii: NOT DETECTED
CANDIDA KRUSEI: NOT DETECTED
CANDIDA TROPICALIS: NOT DETECTED
Candida albicans: DETECTED — AB
Candida glabrata: NOT DETECTED
Candida parapsilosis: NOT DETECTED
ENTEROCOCCUS SPECIES: NOT DETECTED
Enterobacter cloacae complex: NOT DETECTED
Enterobacteriaceae species: NOT DETECTED
Escherichia coli: NOT DETECTED
HAEMOPHILUS INFLUENZAE: NOT DETECTED
KLEBSIELLA OXYTOCA: NOT DETECTED
KLEBSIELLA PNEUMONIAE: NOT DETECTED
LISTERIA MONOCYTOGENES: NOT DETECTED
Neisseria meningitidis: NOT DETECTED
PROTEUS SPECIES: NOT DETECTED
Pseudomonas aeruginosa: NOT DETECTED
SERRATIA MARCESCENS: NOT DETECTED
STAPHYLOCOCCUS AUREUS BCID: NOT DETECTED
STAPHYLOCOCCUS SPECIES: NOT DETECTED
STREPTOCOCCUS SPECIES: NOT DETECTED
Streptococcus agalactiae: NOT DETECTED
Streptococcus pneumoniae: NOT DETECTED
Streptococcus pyogenes: NOT DETECTED

## 2017-07-17 LAB — RENAL FUNCTION PANEL
Albumin: 1.7 g/dL — ABNORMAL LOW (ref 3.5–5.0)
Anion gap: 8 (ref 5–15)
BUN: 81 mg/dL — AB (ref 6–20)
CALCIUM: 7.6 mg/dL — AB (ref 8.9–10.3)
CO2: 16 mmol/L — AB (ref 22–32)
CREATININE: 3.01 mg/dL — AB (ref 0.61–1.24)
Chloride: 111 mmol/L (ref 101–111)
GFR calc Af Amer: 20 mL/min — ABNORMAL LOW (ref >60)
GFR calc non Af Amer: 17 mL/min — ABNORMAL LOW (ref >60)
Glucose, Bld: 209 mg/dL — ABNORMAL HIGH (ref 65–99)
Phosphorus: 4.2 mg/dL (ref 2.5–4.6)
Potassium: 3.9 mmol/L (ref 3.5–5.1)
SODIUM: 135 mmol/L (ref 135–145)

## 2017-07-17 LAB — GLUCOSE, CAPILLARY
GLUCOSE-CAPILLARY: 184 mg/dL — AB (ref 65–99)
GLUCOSE-CAPILLARY: 142 mg/dL — AB (ref 65–99)
Glucose-Capillary: 181 mg/dL — ABNORMAL HIGH (ref 65–99)
Glucose-Capillary: 167 mg/dL — ABNORMAL HIGH (ref 65–99)
Glucose-Capillary: 136 mg/dL — ABNORMAL HIGH (ref 65–99)

## 2017-07-17 LAB — CBC
HCT: 26.9 % — ABNORMAL LOW (ref 39.0–52.0)
Hemoglobin: 9.9 g/dL — ABNORMAL LOW (ref 13.0–17.0)
MCH: 36.7 pg — AB (ref 26.0–34.0)
MCHC: 36.8 g/dL — ABNORMAL HIGH (ref 30.0–36.0)
MCV: 99.6 fL (ref 78.0–100.0)
PLATELETS: 96 10*3/uL — AB (ref 150–400)
RBC: 2.7 MIL/uL — ABNORMAL LOW (ref 4.22–5.81)
RDW: 16.8 % — AB (ref 11.5–15.5)
WBC: 17.4 10*3/uL — AB (ref 4.0–10.5)

## 2017-07-17 LAB — URINE CULTURE: Culture: >=100000 — AB

## 2017-07-17 LAB — LACTIC ACID, PLASMA
Lactic Acid, Venous: 5 mmol/L (ref 0.5–1.9)
Lactic Acid, Venous: 2.8 mmol/L (ref 0.5–1.9)

## 2017-07-17 LAB — CALCIUM / CREATININE RATIO, URINE
CREATININE, UR: 79.6 mg/dL
Calcium, Ur: 2.7 mg/dL
Calcium/Creat.Ratio: 34 mg/g creat (ref 0–260)

## 2017-07-17 MED ORDER — FLUCONAZOLE IN SODIUM CHLORIDE 200-0.9 MG/100ML-% IV SOLN
200.0000 mg | INTRAVENOUS | Status: DC
  Administered 2017-07-18 – 2017-07-22 (×5): 200 mg via INTRAVENOUS
  Filled 2017-07-17 (×5): qty 100

## 2017-07-17 MED ORDER — ADULT MULTIVITAMIN W/MINERALS CH: 1.0000 | ORAL_TABLET | Freq: Every day | ORAL | Status: DC

## 2017-07-17 MED ORDER — ENSURE ENLIVE PO LIQD: 237.0000 mL | Freq: Two times a day (BID) | ORAL | Status: DC

## 2017-07-17 MED ORDER — FLUCONAZOLE IN SODIUM CHLORIDE 400-0.9 MG/200ML-% IV SOLN
400.0000 mg | Freq: Once | INTRAVENOUS | Status: AC
  Administered 2017-07-17: 400 mg via INTRAVENOUS
  Filled 2017-07-17: qty 200

## 2017-07-17 MED ORDER — BOOST / RESOURCE BREEZE PO LIQD CUSTOM
1.0000 | Freq: Two times a day (BID) | ORAL | Status: DC
  Filled 2017-07-17: qty 1

## 2017-07-17 MED ORDER — DEXTROSE 5 % IV SOLN
1.0000 g | INTRAVENOUS | Status: DC
  Administered 2017-07-18: 1 g via INTRAVENOUS
  Filled 2017-07-17 (×2): qty 10

## 2017-07-17 MED ORDER — LABETALOL HCL 5 MG/ML IV SOLN
10.0000 mg | Freq: Once | INTRAVENOUS | Status: AC
  Administered 2017-07-17: 10 mg via INTRAVENOUS
  Filled 2017-07-17: qty 4

## 2017-07-17 NOTE — Progress Notes (Signed)
Occupational Therapy Evaluation Patient Details Name: Maralyn SagoClyde Pressley MRN: [id number] DOB: 02-26-1926 Today's Date: 07/17/2017    History of Present Illness 84 year old male with history of type 2 diabetes, hypertension, gout presented with generalized weakness. Pt with Severe sepsis due to lower urinary tract infection, acute kidney injury and encephalopathy.    Clinical Impression   PTA, pt lived alone at a modified independent level using a RW, however, family staets pt was not taking "good care of himself". Family assisted with meals and other IADL tasks. Family stae pt rarely left the house due to fear of falling. Family also states that their father had more difficulty keeping up with days of the week and did not complete good hygiene/bathing. Pt very lethargic on eval and not following commands. Pt incontinent - nursing notified. Family is feeding pt, however, educated family on importance of not feeding pt while he is so lethargic due to aspiration risk. Recommend Swallow evaluation to assess appropriate diet to reduce risk of aspiration. Will follow acutely and advance activity as pt tolerates. Pt will need rehab at SNF. Family is in agreement with SNF for rehab.     Follow Up Recommendations  SNF;Supervision/Assistance - 24 hour    Equipment Recommendations  Other (comment)(TBA)    Recommendations for Other Services       Precautions / Restrictions Precautions Precautions: Fall      Mobility Bed Mobility Overal bed mobility: Needs Assistance Bed Mobility: Rolling Rolling: Total assist;+2 for physical assistance            Transfers                 General transfer comment: unableot complete due to lethargy  Will need +2    Balance                                           ADL either performed or assessed with clinical judgement   ADL                                         General ADL Comments: total A at this time;  Family feeding pt. Educated family on not trying to feed pt while he is so lethargic; increased risk for aspiration     Vision Baseline Vision/History: Glaucoma;Legally blind(R eye blind)       Perception     Praxis      Pertinent Vitals/Pain Pain Assessment: Faces Faces Pain Scale: Hurts little more Pain Location: generalized discomfort Pain Descriptors / Indicators: Grimacing;Moaning Pain Intervention(s): Limited activity within patient's tolerance     Hand Dominance Right   Extremity/Trunk Assessment Upper Extremity Assessment Upper Extremity Assessment: Generalized weakness(Arthritis B shoulders)   Lower Extremity Assessment Lower Extremity Assessment: Defer to PT evaluation       Communication Communication Communication: HOH   Cognition Arousal/Alertness: Lethargic Behavior During Therapy: Flat affect;Restless Overall Cognitive Status: Impaired/Different from baseline Area of Impairment: Orientation;Attention;Memory;Following commands;Safety/judgement;Awareness;Problem solving                 Orientation Level: Disoriented to;Place;Time;Situation Current Attention Level: Focused Memory: Decreased short-term memory   Safety/Judgement: Decreased awareness of safety;Decreased awareness of deficits Awareness: Intellectual Problem Solving: Slow processing;Decreased initiation;Difficulty sequencing;Requires verbal cues;Requires tactile cues     General Comments  Elevated Troponin  likely due to demand ischemia per MD note    Exercises Exercises: Other exercises Other Exercises Other Exercises: educated fmaily on positionoing to reduce dependent edema B hands and to complete ROM B hands/elbows and shoulders to 90 FF   Shoulder Instructions      Home Living Family/patient expects to be discharged to:: Skilled nursing facility                                        Prior Functioning/Environment Level of Independence: Independent with  assistive device(s)        Comments: Pt lived alone and used a RW. Daughter states that he did his ADL, but was not bathing or taking care of himself very well. Staets he did'nt leave the house often for fear of falling        OT Problem List: Decreased strength;Decreased range of motion;Decreased activity tolerance;Impaired balance (sitting and/or standing);Impaired vision/perception;Decreased coordination;Decreased cognition;Decreased safety awareness;Decreased knowledge of use of DME or AE;Decreased knowledge of precautions;Cardiopulmonary status limiting activity;Pain      OT Treatment/Interventions: Self-care/ADL training;Therapeutic exercise;DME and/or AE instruction;Therapeutic activities;Cognitive remediation/compensation;Visual/perceptual remediation/compensation;Patient/family education;Balance training    OT Goals(Current goals can be found in the care plan section) Acute Rehab OT Goals Patient Stated Goal: per family to get stronger; unsure if pt will be able to live independently again OT Goal Formulation: With family Time For Goal Achievement: 07/31/17 Potential to Achieve Goals: Fair  OT Frequency: Min 2X/week   Barriers to D/C:            Co-evaluation              AM-PAC PT "6 Clicks" Daily Activity     Outcome Measure Help from another person eating meals?: Total Help from another person taking care of personal grooming?: Total Help from another person toileting, which includes using toliet, bedpan, or urinal?: Total Help from another person bathing (including washing, rinsing, drying)?: Total Help from another person to put on and taking off regular upper body clothing?: Total Help from another person to put on and taking off regular lower body clothing?: Total 6 Click Score: 6   End of Session Nurse Communication: Mobility status  Activity Tolerance: Patient limited by lethargy Patient left: in bed;with call bell/phone within reach;with family/visitor  present  OT Visit Diagnosis: Other abnormalities of gait and mobility (R26.89);Muscle weakness (generalized) (M62.81);Other symptoms and signs involving cognitive function;Pain Pain - part of body: (general discomfort)                Time: 0454-0981 OT Time Calculation (min): 21 min Charges:  OT General Charges $OT Visit: 1 Visit OT Evaluation $OT Eval Moderate Complexity: 1 Mod G-Codes:     Amra Shukla, OT/L  [phone] 07/17/2017  Ciji Boston,HILLARY 07/17/2017, 3:12 PM

## 2017-07-17 NOTE — Progress Notes (Signed)
PHARMACY - PHYSICIAN COMMUNICATION CRITICAL VALUE ALERT - BLOOD CULTURE IDENTIFICATION (BCID)  [name] is an 84 y.o. male who presented to [facility] on 07/21/2017 with a chief complaint of Urosepsis  Assessment:  Candida Albicans in blood (include suspected source if known)  Name of physician (or Provider) Contacted: Kirtland BouchardK. Schorr  Current antibiotics: 1/6 Zosyn x >>  1/6 Ceftriaxone >>    Changes to prescribed antibiotics recommended:  Add fluconazole per ID algorithm, adjust for renal function  No results found for this or any previous visit.  [name], [name] 07/17/2017  4:45 AM

## 2017-07-17 NOTE — Care Management Note (Signed)
Case Management Note  Patient Details  Name: Maralyn SagoClyde Joss MRN: [id number] Date of Birth: [birth year]  Subjective/Objective:                  pna and sepsis Oley Balm/lives at home family is active in care.  Action/Plan: Date: July 17, 2017 Marcelle SmilingRhonda Davis, BSN, RogersRN3, [place]M [phone] Chart and notes review for patient progress and needs. Will follow for case management and discharge needs. Next review date: [id number]  Expected Discharge Date:                  Expected Discharge Plan:  Home/Self Care  In-House Referral:     Discharge planning Services  CM Consult  Post Acute Care Choice:    Choice offered to:     DME Arranged:    DME Agency:     HH Arranged:    HH Agency:     Status of Service:  In process, will continue to follow  If discussed at Long Length of Stay Meetings, dates discussed:    Additional Comments:  Golda AcreDavis, Rhonda Lynn, RN 07/17/2017, 8:19 AM

## 2017-07-17 NOTE — Progress Notes (Signed)
PROGRESS NOTE    [name]  WJX:[id number] DOB: 07/29/1925 DOA: Jul 25, 2017 PCP: [name], MD   Brief Narrative: 84 year old male with history of type 2 diabetes, hypertension, gout presented with generalized weakness.  Patient was recently treated with Keflex for UTI.  Patient with urinary incontinence, dysuria urgency.  He lives by himself.  Admitted for UTI, acute kidney injury and sepsis. Examination consistent with bladder distention therefore Foley catheter was inserted on 1/7, drained 2600 cc of urine which was cloudy.  Assessment & Plan:   #Severe sepsis due to candidemia/acute lower UTI. -Blood culture growing Candida.  On IV fluconazole.  Also on IV ceftriaxone.  Follow-up urine culture and final blood culture result.  Leukocytosis and lactic acid level trending down.  Patient reported feeling somewhat better.  ID consult requested and discussed with Dr. Ilsa Iha -Continue IV fluid and supportive care.  #Acute kidney injury likely ATN in the setting of severe sepsis, acute urinary retention, UTI: -Ultrasound of kidneys with no hydronephrosis.  Patient with distended urinary bladder.  Foley catheter was inserted with 2.6 L of urine.  It was cloudy and looks pus as per patient's nurse. -Serum creatinine level trending down, patient has dark pinkish urine after Foley catheter insertion likely because of decompression of chronically stretched bladder.  Patient likely needs Foley catheter for a few weeks.  Continue to monitor.  #Hypotension due to sepsis.  On IV fluid.  Monitor blood pressure.  Improving  #Elevated troponin likely demand ischemia in the setting of sepsis and reduced GFR.  Echocardiogram with EF of 60-65% with normal wall motion.  Patient has no chest pain.  #Acute metabolic encephalopathy: Due to sepsis.  Has no focal neurological deficit.  Mental status slowly improving.  Provide supportive care.  #Thrombocytopenia: Due to sepsis.  No sign of bleeding.  Monitor  labs.  #Type 2 diabetes: Monitor blood sugar level.  Continue sliding scale.  Goals of care discussion: Currently full code.  Consulted palliative care.  I have discussed the patient's daughter over the phone today.  #Anemia: Drop in hemoglobin likely contributed by mild hematuria after Foley catheter insertion.  Monitor CBC.  Also received IV fluid.  DVT prophylaxis:SCD.   Code Status: Full code Family Communication: Discussed with the patient's daughter over the phone.  Disposition Plan: Currently admitted    Consultants:   None  Procedures: Insert Foley catheter on 1/7 Antimicrobials: Ceftriaxone  Subjective: Seen and examined at bedside.  Alert awake and following simple commands.  Denies pain, nausea vomiting.  Knows he is in the hospital.  Objective: Vitals:   07/17/17 0700 07/17/17 0800 07/17/17 1100 07/17/17 1206  BP: (!) 161/74 (!) 158/79 (!) 152/78   Pulse: (!) 104 (!) 109 (!) 104   Resp: 20  12   Temp:  98.5 F (36.9 C)  97.8 F (36.6 C)  TempSrc:  Oral  Oral  SpO2: 98% 100% 99%   Weight:      Height:        Intake/Output Summary (Last 24 hours) at 07/17/2017 1335 Last data filed at 07/17/2017 2130 Gross per 24 hour  Intake 2040.42 ml  Output 1000 ml  Net 1040.42 ml   Filed Weights   07/16/17 0452 07/17/17 0500  Weight: 87.3 kg (192 lb 7.4 oz) 87.4 kg (192 lb 10.9 oz)    Examination:  General exam: Ill looking male lying in bed, not in distress Respiratory system: Clear bilateral, respiratory for normal. Cardiovascular system: Regular rate rhythm S1-S2 normal.  No pedal edema.. Gastrointestinal system: Abdomen soft, nontender.  Bowel sounds positive. Central nervous system: Alert awake and following commands  Skin: No rashes, lesions or ulcers Psychiatry: Judgement and insight appear impaired Foley catheter with  pinkish urine   Data Reviewed: I have personally reviewed following labs and imaging studies  CBC: Recent Labs  Lab 07/11/2017 1816  07/24/2017 2054 07/16/17 0725 07/17/17 0243  WBC 16.4*  --  20.4* 17.4*  HGB 12.3* 12.9* 11.8* 9.9*  HCT 33.7* 38.0* 32.7* 26.9*  MCV 101.2*  --  100.6* 99.6  PLT 122*  --  114* 96*   Basic Metabolic Panel: Recent Labs  Lab 07/28/2017 2054 07/16/17 0129 07/16/17 0725 07/17/17 0243  NA 133*  --  131* 135  K 5.0  --  5.0 3.9  CL 100*  --  103 111  CO2  --   --  16* 16*  GLUCOSE 210*  --  183* 209*  BUN 62*  --  82* 81*  CREATININE 3.80* 3.71* 3.95* 3.01*  CALCIUM  --   --  8.3* 7.6*  PHOS  --   --   --  4.2   GFR: Estimated Creatinine Clearance: 17.2 mL/min (A) (by C-G formula based on SCr of 3.01 mg/dL (H)). Liver Function Tests: Recent Labs  Lab 07/10/2017 2015 07/16/17 0725 07/17/17 0243  AST 67* 62*  --   ALT 35 33  --   ALKPHOS 136* 126  --   BILITOT 4.2* 3.7*  --   PROT 8.4* 7.1  --   ALBUMIN 2.4* 2.1* 1.7*   Recent Labs  Lab 07/13/2017 2015  LIPASE 41   No results for input(s): AMMONIA in the last 168 hours. Coagulation Profile: No results for input(s): INR, PROTIME in the last 168 hours. Cardiac Enzymes: Recent Labs  Lab 07/16/17 0129 07/16/17 0725 07/16/17 1252  TROPONINI 0.05* 0.05* 0.06*   BNP (last 3 results) No results for input(s): PROBNP in the last 8760 hours. HbA1C: Recent Labs    07/16/17 0129  HGBA1C 8.5*   CBG: Recent Labs  Lab 07/16/17 1710 07/16/17 2119 07/17/17 0817 07/17/17 0907 07/17/17 1133  GLUCAP 162* 208* 184* 181* 167*   Lipid Profile: No results for input(s): CHOL, HDL, LDLCALC, TRIG, CHOLHDL, LDLDIRECT in the last 72 hours. Thyroid Function Tests: No results for input(s): TSH, T4TOTAL, FREET4, T3FREE, THYROIDAB in the last 72 hours. Anemia Panel: No results for input(s): VITAMINB12, FOLATE, FERRITIN, TIBC, IRON, RETICCTPCT in the last 72 hours. Sepsis Labs: Recent Labs  Lab 08/09/2017 2158 07/16/17 1252 07/16/17 1609 07/17/17 0243  LATICACIDVEN 5.26* 5.8* 5.0* 2.8*    Recent Results (from the past 240  hour(s))  Urine culture     Status: Abnormal   Collection Time: 07/09/17 12:31 PM  Result Value Ref Range Status   Specimen Description URINE, CLEAN CATCH  Final   Special Requests NONE  Final   Culture MULTIPLE SPECIES PRESENT, SUGGEST RECOLLECTION (A)  Final   Report Status 07/10/2017 FINAL  Final  Blood culture (routine x 2)     Status: None (Preliminary result)   Collection Time: 07/27/2017  8:15 PM  Result Value Ref Range Status   Specimen Description BLOOD RIGHT FOREARM  Final   Special Requests   Final    BOTTLES DRAWN AEROBIC AND ANAEROBIC Blood Culture adequate volume   Culture  Setup Time   Final    YEAST AEROBIC BOTTLE ONLY Organism ID to follow CRITICAL RESULT CALLED TO, READ BACK BY AND VERIFIED  WITHPeggyann Juba [facility] 1610 07/17/17 A BROWNING    Culture   Final    NO GROWTH 1 DAY Performed at [facility], [address] [place], [place] [address]    Report Status PENDING  Incomplete  Blood Culture ID Panel (Reflexed)     Status: Abnormal   Collection Time: 07/29/2017  8:15 PM  Result Value Ref Range Status   Enterococcus species NOT DETECTED NOT DETECTED Final   Listeria monocytogenes NOT DETECTED NOT DETECTED Final   Staphylococcus species NOT DETECTED NOT DETECTED Final   Staphylococcus aureus NOT DETECTED NOT DETECTED Final   Streptococcus species NOT DETECTED NOT DETECTED Final   Streptococcus agalactiae NOT DETECTED NOT DETECTED Final   Streptococcus pneumoniae NOT DETECTED NOT DETECTED Final   Streptococcus pyogenes NOT DETECTED NOT DETECTED Final   Acinetobacter baumannii NOT DETECTED NOT DETECTED Final   Enterobacteriaceae species NOT DETECTED NOT DETECTED Final   Enterobacter cloacae complex NOT DETECTED NOT DETECTED Final   Escherichia coli NOT DETECTED NOT DETECTED Final   Klebsiella oxytoca NOT DETECTED NOT DETECTED Final   Klebsiella pneumoniae NOT DETECTED NOT DETECTED Final   Proteus species NOT DETECTED NOT DETECTED Final   Serratia  marcescens NOT DETECTED NOT DETECTED Final   Haemophilus influenzae NOT DETECTED NOT DETECTED Final   Neisseria meningitidis NOT DETECTED NOT DETECTED Final   Pseudomonas aeruginosa NOT DETECTED NOT DETECTED Final   Candida albicans DETECTED (A) NOT DETECTED Final    Comment: CRITICAL RESULT CALLED TO, READ BACK BY AND VERIFIED WITH: JC GRIMSLEY PHARMD AT 0445 BY AB    Candida glabrata NOT DETECTED NOT DETECTED Final   Candida krusei NOT DETECTED NOT DETECTED Final   Candida parapsilosis NOT DETECTED NOT DETECTED Final   Candida tropicalis NOT DETECTED NOT DETECTED Final    Comment: Performed at [facility], [address], [place], [place] [address]  Blood culture (routine x 2)     Status: Abnormal (Preliminary result)   Collection Time: Jul 29, 2017  8:44 PM  Result Value Ref Range Status   Specimen Description BLOOD LEFT FOREARM  Final   Special Requests IN PEDIATRIC BOTTLE Blood Culture adequate volume  Final   Culture  Setup Time (A)  Final    YEAST IN PEDIATRIC BOTTLE CRITICAL VALUE NOTED.  VALUE IS CONSISTENT WITH PREVIOUSLY REPORTED AND CALLED VALUE. Performed at [facility], [address], [place], [place] [address]    Culture YEAST (A)  Final   Report Status PENDING  Incomplete  MRSA PCR Screening     Status: None   Collection Time: 07/16/17  3:15 PM  Result Value Ref Range Status   MRSA by PCR NEGATIVE NEGATIVE Final    Comment:        The GeneXpert MRSA Assay (FDA approved for NASAL specimens only), is one component of a comprehensive MRSA colonization surveillance program. It is not intended to diagnose MRSA infection nor to guide or monitor treatment for MRSA infections.          Radiology Studies: US Renal  Result Date: 07/29/17 CLINICAL DATA:  Acute renal failure. EXAM: RENAL / URINARY TRACT ULTRASOUND COMPLETE COMPARISON:  None. FINDINGS: Examination is technically limited due to shadowing bowel gas. Right Kidney: Only faintly visualized.  Length: 10.1 cm. No obvious hydronephrosis, however significantly limited evaluation. Left Kidney: Only faintly visualized. Length: 10.9 cm. No obvious hydronephrosis, however significantly limited evaluation. Bladder: Appears normal for degree of bladder distention. Bladder volume approximately 15.2 x  16.7 x 21.3 cm (volume = 2830 cm^3). IMPRESSION: 1. Marked urinary bladder distention. 2. Kidneys poorly visualized. No gross hydronephrosis, however limited exam due to shadowing bowel gas obscuring the kidneys. Electronically Signed   By: Rubye Oaks M.D.   On: 07/27/17 23:42   Dg Chest Port 1 View  Result Date: 07/27/2017 CLINICAL DATA:  Pt has been getting weaker and weaker recently. Pt was not able to stand under his own power today. Pt taking HTN meds. Pt is diabetic. Nonsmoker. Pt has a hx of smoking pipes and cigars. EXAM: PORTABLE CHEST 1 VIEW COMPARISON:  02/27/2013 FINDINGS: Heart is enlarged. Shallow lung inflation. There are no focal consolidations or pleural effusions. No pulmonary edema. Chronic changes in both shoulders. IMPRESSION: Shallow lung inflation.  No evidence for acute  abnormality. Electronically Signed   By: Norva Pavlov M.D.   On: 07/27/2017 20:09        Scheduled Meds: . allopurinol  100 mg Oral Daily  . aspirin EC  81 mg Oral Daily  . brimonidine  1 drop Right Eye TID  . dorzolamide-timolol  1 drop Both Eyes BID  . feeding supplement  1 Container Oral BID BM  . feeding supplement (ENSURE ENLIVE)  237 mL Oral BID BM  . Influenza vac split quadrivalent PF  0.5 mL Intramuscular Tomorrow-1000  . insulin aspart  0-5 Units Subcutaneous QHS  . insulin aspart  0-9 Units Subcutaneous TID WC  . latanoprost  1 drop Both Eyes QHS  . multivitamin with minerals  1 tablet Oral Daily  . pilocarpine  1 drop Right Eye TID  . pneumococcal 23 valent vaccine  0.5 mL Intramuscular Tomorrow-1000   Continuous Infusions: . [START ON 07/18/2017] fluconazole (DIFLUCAN) IV        LOS: 2 days    Dorris Pierre Jaynie Collins, MD [facility] Pager [phone]  If 7PM-7AM, please contact night-coverage [email or web address] Password [email or web address] 07/17/2017, 1:35 PM
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
## 2017-07-17 NOTE — Progress Notes (Signed)
Initial Nutrition Assessment  DOCUMENTATION CODES:   (Will assess for malnutrition at follow-up)  INTERVENTION:  - Continue Ensure Enlive BID, each supplement provides 350 kcal and 20 grams of protein - Will order Boost Breeze BID, each supplement provides 250 kcal and 9 grams of protein - Will order daily multivitamin with minerals. - Continue to encourage PO intakes.   NUTRITION DIAGNOSIS:   Inadequate oral intake related to acute illness, lethargy/confusion as evidenced by per patient/family report.  GOAL:   Patient will meet greater than or equal to 90% of their needs  MONITOR:   PO intake, Supplement acceptance, Weight trends, Labs  REASON FOR ASSESSMENT:   Malnutrition Screening Tool  ASSESSMENT:    84 year old male with history of type 2 diabetes, hypertension, gout presented with generalized weakness.  Patient was recently treated with Keflex for UTI.  Patient with urinary incontinence, dysuria urgency.  He lives by himself.  Admitted for UTI, acute kidney injury and sepsis.  BMI indicates obesity. No intakes since admission. Pt sleeping at time of RD visit and his daughter was at bedside. Breakfast tray on bedside table untouched and daughter reported pt felt he would throw up if he tried to eat; she confirmed he was feeling nauseated and that this has only been going on for 1-2 days. She states that for the past few weeks (started sometime between Thanksgiving and Christmas) pt has had a decreased appetite and has not been eating or drinking much. Pt lives alone and family had been providing him with Ensure (which he was drinking but amount each day is unknown) and had been putting cups of fluid in the refrigerator so pt could easily access them; family was concerned about how little pt had been drinking.   Unable to obtain any further information at this time as daughter was very anxious to talk with MD. Per chart review, weight has fluctuated (191-202 lbs) since 12/14/14.  Most recently, he has lost 9 lbs (4.5% body weight) in the past 3 months which is not significant for time frame.  Medications reviewed; Labs reviewed; CBG: 181 mg/dL this AM, BUN: 81 mg/dL, creatinine: 8.293.01 mg/dL, Ca: 7.6 mg/dL, GFR: 20 mL/min.        NUTRITION - FOCUSED PHYSICAL EXAM:  Family requested this be done at later date.  Diet Order:  Diet heart healthy/carb modified Room service appropriate? Yes; Fluid consistency: Thin  EDUCATION NEEDS:   No education needs have been identified at this time  Skin:  Skin Assessment: Reviewed RN Assessment  Last BM:  07/16/17  Height:   Ht Readings from Last 1 Encounters:  07/16/17 5\' 6"  (1.676 m)    Weight:   Wt Readings from Last 1 Encounters:  07/17/17 192 lb 10.9 oz (87.4 kg)    Ideal Body Weight:  64.54 kg  BMI:  Body mass index is 31.1 kg/m.  Estimated Nutritional Needs:   Kcal:  1575-1750 (18-20 kcal/kg)  Protein:  60-70 grams  Fluid:  >/= 1.8 L/day     Trenton GammonJessica Shaneque Merkle, MS, RD, LDN, [facility] Inpatient Clinical Dietitian Pager # [phone] After hours/weekend pager # [phone]

## 2017-07-17 NOTE — Progress Notes (Signed)
PT Cancellation Note  Patient Details Name: [name] MRN: [id number] DOB: 23-Jul-1925   Cancelled Treatment:    Reason Eval/Treat Not Completed: Fatigue/lethargy limiting ability to participate;Medical issues which prohibited therapy, will check back when able to participate in mobility. Nursing in moving patient, patient yelling out.    Rada HayHill, Tanette Chauca [name] 07/17/2017, 12:39 PM Blanchard KelchKaren Millette Halberstam PT [phone]

## 2017-07-17 NOTE — Progress Notes (Signed)
Pharmacy Antibiotic Note  [name] is a 84 y.o. male admitted on 07/14/2017 with Candida albicans in blood.  Pharmacy has been consulted for Fluconazole dosing.  Plan: Fluconazole 400mg  iv x1, then 200mg  iv q24hr (50% dose reduction due to renal function   Height: 5\' 6"  (167.6 cm) Weight: 192 lb 7.4 oz (87.3 kg) IBW/kg (Calculated) : 63.8  Temp (24hrs), Avg:99.8 F (37.7 C), Min:98.9 F (37.2 C), Max:101.2 F (38.4 C)  Recent Labs  Lab 2018/02/24 1816 2018/02/24 2054 2018/02/24 2158 07/16/17 0129 07/16/17 0725 07/16/17 1252 07/16/17 1609 07/17/17 0243  WBC 16.4*  --   --   --  20.4*  --   --  17.4*  CREATININE  --  3.80*  --  3.71* 3.95*  --   --  3.01*  LATICACIDVEN  --  5.63* 5.26*  --   --  5.8* 5.0* 2.8*    Estimated Creatinine Clearance: 17.2 mL/min (A) (by C-G formula based on SCr of 3.01 mg/dL (H)).    Allergies  Allergen Reactions  . Iodine     REACTION: rash---whleps  . Dipivefrin Rash  . Methazolamide Rash  . Metoclopramide Rash    Hives    Antimicrobials this admission: 1/6 Zosyn x >>  1/6 Ceftriaxone >>  1/8 fluconazole >>  Dose adjustments this admission: -  Microbiology results: pending  Thank you for allowing pharmacy to be a part of this patient's care.  Aleene DavidsonGrimsley Jr, [place] 07/17/2017 4:43 AM

## 2017-07-18 ENCOUNTER — Inpatient Hospital Stay (HOSPITAL_COMMUNITY): Payer: Medicare Other

## 2017-07-18 DIAGNOSIS — Z515 Encounter for palliative care: Secondary | ICD-10-CM

## 2017-07-18 LAB — GLUCOSE, CAPILLARY
GLUCOSE-CAPILLARY: 123 mg/dL — AB (ref 65–99)
Glucose-Capillary: 126 mg/dL — ABNORMAL HIGH (ref 65–99)
Glucose-Capillary: 109 mg/dL — ABNORMAL HIGH (ref 65–99)
Glucose-Capillary: 129 mg/dL — ABNORMAL HIGH (ref 65–99)

## 2017-07-18 LAB — RENAL FUNCTION PANEL
ALBUMIN: 1.7 g/dL — AB (ref 3.5–5.0)
Anion gap: 6 (ref 5–15)
BUN: 78 mg/dL — AB (ref 6–20)
CO2: 20 mmol/L — ABNORMAL LOW (ref 22–32)
CREATININE: 2.51 mg/dL — AB (ref 0.61–1.24)
Calcium: 8.2 mg/dL — ABNORMAL LOW (ref 8.9–10.3)
Chloride: 116 mmol/L — ABNORMAL HIGH (ref 101–111)
GFR, EST AFRICAN AMERICAN: 25 mL/min — AB (ref >60)
GFR, EST NON AFRICAN AMERICAN: 21 mL/min — AB (ref >60)
Glucose, Bld: 129 mg/dL — ABNORMAL HIGH (ref 65–99)
POTASSIUM: 3.6 mmol/L (ref 3.5–5.1)
Phosphorus: 3.7 mg/dL (ref 2.5–4.6)
Sodium: 142 mmol/L (ref 135–145)

## 2017-07-18 LAB — CULTURE, BLOOD (ROUTINE X 2): Special Requests: ADEQUATE

## 2017-07-18 LAB — CBC
HEMATOCRIT: 31.4 % — AB (ref 39.0–52.0)
Hemoglobin: 11.5 g/dL — ABNORMAL LOW (ref 13.0–17.0)
MCH: 36.3 pg — ABNORMAL HIGH (ref 26.0–34.0)
MCHC: 36.6 g/dL — ABNORMAL HIGH (ref 30.0–36.0)
MCV: 99.1 fL (ref 78.0–100.0)
PLATELETS: 90 10*3/uL — AB (ref 150–400)
RBC: 3.17 MIL/uL — AB (ref 4.22–5.81)
RDW: 17 % — AB (ref 11.5–15.5)
WBC: 16.8 10*3/uL — AB (ref 4.0–10.5)

## 2017-07-18 NOTE — Progress Notes (Signed)
PT Cancellation Note  Patient Details Name: [name] MRN: [id number] DOB: 09-Mar-1926   Cancelled Treatment:     PT eval deferred this am - RN advises pt obtunded and unable to actively participate.  Will follow.   [name] 07/18/2017, 11:54 AM

## 2017-07-18 NOTE — Progress Notes (Signed)
[facility] for Infectious Disease    Date of Admission:  08/03/2017   Total days of antibiotics 4        Day 3 fluconazole        Day 3 ceftriaxone   ID: [name] is a 84 y.o. male presents with sepsis 2/2 candidal uti with secondary fungemia Principal Problem:   Sepsis ([place]) Active Problems:   Type II diabetes mellitus with manifestations ([place])   Anemia   Goals of care, counseling/discussion   UTI (urinary tract infection)   ARF (acute renal failure) (HCC)   Thrombocytopenia (HCC)   Hyponatremia   Encounter for palliative care    Subjective: Had urinary obstruction last night requiring foley which emptied 2.6L of urine Family met with palliative care Noted to have hematuria in his foley bag Still not responding much to command, son reports he moves his right arm and opens eyes more often  Medications:  . allopurinol  100 mg Oral Daily  . aspirin EC  81 mg Oral Daily  . brimonidine  1 drop Right Eye TID  . dorzolamide-timolol  1 drop Both Eyes BID  . feeding supplement  1 Container Oral BID BM  . feeding supplement (ENSURE ENLIVE)  237 mL Oral BID BM  . Influenza vac split quadrivalent PF  0.5 mL Intramuscular Tomorrow-1000  . insulin aspart  0-5 Units Subcutaneous QHS  . insulin aspart  0-9 Units Subcutaneous TID WC  . latanoprost  1 drop Both Eyes QHS  . multivitamin with minerals  1 tablet Oral Daily  . pilocarpine  1 drop Right Eye TID  . pneumococcal 23 valent vaccine  0.5 mL Intramuscular Tomorrow-1000    Objective: Vital signs in last 24 hours: Temp:  [97.3 F (36.3 C)-99.5 F (37.5 C)] 97.3 F (36.3 C) (01/09 0800) Pulse Rate:  [52-118] 111 (01/09 1300) Resp:  [19-49] 24 (01/09 1300) BP: (134-182)/(64-135) 163/80 (01/09 1300) SpO2:  [96 %-100 %] 100 % (01/09 1300) Physical Exam  Constitutional: sleeping. He appears chronically ill. No distress.  HENT:  Mouth/Throat: Oropharynx is dry. Poor dentition. Missing majority of  teeth. Cardiovascular: Normal rate, regular rhythm and normal heart sounds. Exam reveals no gallop and no friction rub.  No murmur heard.  Pulmonary/Chest: Effort normal and breath sounds normal. No respiratory distress. He has no wheezes.  Abdominal: Soft. Bowel sounds are minimal. He exhibits mild distension. + tenderness to mild palpation Lymphadenopathy:  He has no cervical adenopathy.  Neurological: left gaze preference that I didn't appreciate yesterday Skin: Skin is warm and dry. No rash noted. No erythema.  Psychiatric: He has a normal mood and affect. His behavior is normal.     Lab Results Recent Labs    07/17/17 0243 07/18/17 0244  WBC 17.4* 16.8*  HGB 9.9* 11.5*  HCT 26.9* 31.4*  NA 135 142  K 3.9 3.6  CL 111 116*  CO2 16* 20*  BUN 81* 78*  CREATININE 3.01* 2.51*   Liver Panel Recent Labs    07/26/2017 2015 07/16/17 0725 07/17/17 0243 07/18/17 0244  PROT 8.4* 7.1  --   --   ALBUMIN 2.4* 2.1* 1.7* 1.7*  AST 67* 62*  --   --   ALT 35 33  --   --   ALKPHOS 136* 126  --   --   BILITOT 4.2* 3.7*  --   --   BILIDIR 1.7*  --   --   --   IBILI 2.5*  --   --   --  Microbiology: 1/6 blood cx c.albicans 1/6 urine cx yeast Studies/Results: Ct Head Wo Contrast  Result Date: 07/18/2017 CLINICAL DATA:  Metabolic encephalopathy with altered mental status EXAM: CT HEAD WITHOUT CONTRAST TECHNIQUE: Contiguous axial images were obtained from the base of the skull through the vertex without intravenous contrast. COMPARISON:  None. FINDINGS: Brain: There is moderate diffuse atrophy. There is no intracranial mass, hemorrhage, extra-axial fluid collection, or midline shift. There is widespread small vessel disease throughout the centra semiovale bilaterally. There is small vessel disease in each internal capsule. No acute appearing infarct evident. Vascular: There is no appreciable hyperdense vessel. There is calcification in each carotid siphon region. Skull: The bony  calvarium appears intact. Sinuses/Orbits: There is opacification in both maxillary antra. There is mucosal thickening in several ethmoid air cells bilaterally. Other paranasal sinuses are clear. Orbits appear symmetric bilaterally. Other: Mastoid air cells are clear. IMPRESSION: Atrophy with extensive supratentorial small vessel disease. No evident acute infarct. No mass or hemorrhage. There are foci of arterial vascular calcification. There are multiple foci of paranasal sinus disease. Electronically Signed   By: Lowella Grip III M.D.   On: 07/18/2017 11:21     Assessment/Plan: Fungemia = continue on fluconazole. We will repeat blood cultures for now to see that he is clearing his source of infection. If he clinically improves, recommend to get ophtho to evaluate for endolphalmitis  aki on ckd = appears improving since having foley placed  uti = continue on fluconazole for candidal uti, likely ascending infection causing his bacteremia  Abdominal discomfort = possibly has ileus, if clinical exam worsens, recommend KUB.  Overall prognosis appears poor but would still give another day or 2 to see if he has response to therapy  [facility] for Infectious Diseases Cell: [phone] Pager: [phone]  07/18/2017, 1:24 PM
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
## 2017-07-18 NOTE — Progress Notes (Addendum)
[facility] PROGRESS NOTE  Maralyn SagoClyde Demello JYN:[id number] DOB: 05/12/1926 DOA: 07/20/2017 PCP: Etta GrandchildJones, Thomas L, MD  Brief summary   84 year old male with history of type 2 diabetes, hypertension, gout presented with generalized weakness.  Patient was recently treated with Keflex for UTI.  Patient with urinary incontinence, dysuria urgency.  He lives by himself.  Admitted for UTI, acute kidney injury and sepsis. Examination consistent with bladder distention therefore Foley catheter was inserted on 1/7, drained 2600 cc of urine which was cloudy.   Assessment/Plan:   Severe sepsis due to candidemia/acute lower UTI. -Blood culture growing Candida.  On IV fluconazole.  Also on IV ceftriaxone.  Follow-up urine culture and final blood culture result.  Leukocytosis and lactic acid level trending down.  ID consult appreciated. Continue IV fluid and supportive care.  Acute kidney injury likely ATN and obstructive nephropathy.  in the setting of severe sepsis, acute urinary retention, UTI: -Ultrasound of kidneys with no hydronephrosis.  Patient with distended urinary bladder.  Foley catheter was inserted with 2.6 L of urine.  It was cloudy and looks pus as per patient's nurse. -Serum creatinine level trending down, patient has dark pinkish urine after Foley catheter insertion likely because of decompression of chronically stretched bladder.  Patient likely needs Foley catheter for a few weeks.  Continue to monitor.  Hypotension due to sepsis.  On IV fluid.  Monitor blood pressure.  Improving  Elevated troponin likely demand ischemia in the setting of sepsis and reduced GFR.  Echocardiogram with EF of 60-65% with normal wall motion.  Patient had no chest pain.  Acute metabolic encephalopathy: Due to sepsis.  Had no focal neurological deficit.  Mental status was slowly improving. But, today worsened.  -Will check head CT  Thrombocytopenia: Due to sepsis.  No sign of bleeding.  Monitor  labs.  Type 2 diabetes: Monitor blood sugar level.  Continue sliding scale.  Anemia: Drop in hemoglobin likely contributed by mild hematuria after Foley catheter insertion.  Monitor CBC.  Also received IV fluid. Hg is stable now.    Goals of care discussion: Currently full code.  Consulted palliative care.  Dr. Ronalee BeltsBhandari discussed the patient's daughter over the phone. I have also called but no answer. Will try again     Code Status: full Family Communication: d/w patient, RN (indicate person spoken with, relationship, and if by phone, the number) Disposition Plan: pend improvement    Consultants:  ID  Procedures:  none  Antibiotics: Anti-infectives (From admission, onward)   Start     Dose/Rate Route Frequency Ordered Stop   07/18/17 1400  cefTRIAXone (ROCEPHIN) 1 g in dextrose 5 % 50 mL IVPB     1 g 100 mL/hr over 30 Minutes Intravenous Every 24 hours 07/17/17 1346     07/18/17 0600  fluconazole (DIFLUCAN) IVPB 200 mg     200 mg 100 mL/hr over 60 Minutes Intravenous Every 24 hours 07/17/17 0442     07/17/17 0445  fluconazole (DIFLUCAN) IVPB 400 mg     400 mg 100 mL/hr over 120 Minutes Intravenous  Once 07/17/17 0442 07/17/17 0705   07/16/17 1400  cefTRIAXone (ROCEPHIN) 2 g in dextrose 5 % 50 mL IVPB  Status:  Discontinued     2 g 100 mL/hr over 30 Minutes Intravenous Every 24 hours 07/16/17 1041 07/17/17 1334   07/16/17 0109  cefTRIAXone (ROCEPHIN) 1 g in dextrose 5 % 50 mL IVPB  Status:  Discontinued     1 g 100 mL/hr over  30 Minutes Intravenous Every 24 hours 07/16/17 0109 07/16/17 1041   07-25-17 2100  piperacillin-tazobactam (ZOSYN) IVPB 3.375 g     3.375 g 100 mL/hr over 30 Minutes Intravenous  Once 2017/07/25 2055 07/25/17 2300        (indicate start date, and stop date if known)  HPI/Subjective: Does not follow commands. Mild febrile. Persistent leukocytosis.   Objective: Vitals:   07/18/17 0400 07/18/17 0700  BP: 136/76 137/64  Pulse: (!) 116 (!)  117  Resp: (!) 28 (!) 49  Temp: 98.7 F (37.1 C)   SpO2: 97% 98%    Intake/Output Summary (Last 24 hours) at 07/18/2017 0821 Last data filed at 07/18/2017 0400 Gross per 24 hour  Intake 120 ml  Output 1700 ml  Net -1580 ml   Filed Weights   07/16/17 0452 07/17/17 0500  Weight: 87.3 kg (192 lb 7.4 oz) 87.4 kg (192 lb 10.9 oz)    Exam:   General:  Does not follow commands.   Cardiovascular: s1,s2 rrr  Respiratory: few crackles LL  Abdomen: soft, nt, nd   Musculoskeletal: no pedal edema    Data Reviewed: Basic Metabolic Panel: Recent Labs  Lab 25-Jul-2017 2054 07/16/17 0129 07/16/17 0725 07/17/17 0243 07/18/17 0244  NA 133*  --  131* 135 142  K 5.0  --  5.0 3.9 3.6  CL 100*  --  103 111 116*  CO2  --   --  16* 16* 20*  GLUCOSE 210*  --  183* 209* 129*  BUN 62*  --  82* 81* 78*  CREATININE 3.80* 3.71* 3.95* 3.01* 2.51*  CALCIUM  --   --  8.3* 7.6* 8.2*  PHOS  --   --   --  4.2 3.7   Liver Function Tests: Recent Labs  Lab 2017-07-25 2015 07/16/17 0725 07/17/17 0243 07/18/17 0244  AST 67* 62*  --   --   ALT 35 33  --   --   ALKPHOS 136* 126  --   --   BILITOT 4.2* 3.7*  --   --   PROT 8.4* 7.1  --   --   ALBUMIN 2.4* 2.1* 1.7* 1.7*   Recent Labs  Lab Jul 25, 2017 2015  LIPASE 41   No results for input(s): AMMONIA in the last 168 hours. CBC: Recent Labs  Lab Jul 25, 2017 1816 07-25-2017 2054 07/16/17 0725 07/17/17 0243 07/18/17 0244  WBC 16.4*  --  20.4* 17.4* 16.8*  HGB 12.3* 12.9* 11.8* 9.9* 11.5*  HCT 33.7* 38.0* 32.7* 26.9* 31.4*  MCV 101.2*  --  100.6* 99.6 99.1  PLT 122*  --  114* 96* 90*   Cardiac Enzymes: Recent Labs  Lab 07/16/17 0129 07/16/17 0725 07/16/17 1252  TROPONINI 0.05* 0.05* 0.06*   BNP (last 3 results) No results for input(s): BNP in the last 8760 hours.  ProBNP (last 3 results) No results for input(s): PROBNP in the last 8760 hours.  CBG: Recent Labs  Lab 07/17/17 0817 07/17/17 0907 07/17/17 1133 07/17/17 1553  07/17/17 2240  GLUCAP 184* 181* 167* 136* 142*    Recent Results (from the past 240 hour(s))  Urine culture     Status: Abnormal   Collection Time: 07/09/17 12:31 PM  Result Value Ref Range Status   Specimen Description URINE, CLEAN CATCH  Final   Special Requests NONE  Final   Culture MULTIPLE SPECIES PRESENT, SUGGEST RECOLLECTION (A)  Final   Report Status 07/10/2017 FINAL  Final  Urine culture  Status: Abnormal   Collection Time: 08/04/2017  7:48 PM  Result Value Ref Range Status   Specimen Description URINE, RANDOM  Final   Special Requests NONE  Final   Culture >=100,000 COLONIES/mL YEAST (A)  Final   Report Status 07/17/2017 FINAL  Final  Blood culture (routine x 2)     Status: None (Preliminary result)   Collection Time: 07/23/2017  8:15 PM  Result Value Ref Range Status   Specimen Description BLOOD RIGHT FOREARM  Final   Special Requests   Final    BOTTLES DRAWN AEROBIC AND ANAEROBIC Blood Culture adequate volume   Culture  Setup Time   Final    YEAST AEROBIC BOTTLE ONLY Organism ID to follow CRITICAL RESULT CALLED TO, READ BACK BY AND VERIFIED WITHPeggyann Juba [facility] [id number] 07/17/17 A BROWNING    Culture   Final    NO GROWTH 1 DAY Performed at [facility], [address], [place], [place] [address]    Report Status PENDING  Incomplete  Blood Culture ID Panel (Reflexed)     Status: Abnormal   Collection Time: 08/02/2017  8:15 PM  Result Value Ref Range Status   Enterococcus species NOT DETECTED NOT DETECTED Final   Listeria monocytogenes NOT DETECTED NOT DETECTED Final   Staphylococcus species NOT DETECTED NOT DETECTED Final   Staphylococcus aureus NOT DETECTED NOT DETECTED Final   Streptococcus species NOT DETECTED NOT DETECTED Final   Streptococcus agalactiae NOT DETECTED NOT DETECTED Final   Streptococcus pneumoniae NOT DETECTED NOT DETECTED Final   Streptococcus pyogenes NOT DETECTED NOT DETECTED Final   Acinetobacter baumannii NOT DETECTED NOT DETECTED  Final   Enterobacteriaceae species NOT DETECTED NOT DETECTED Final   Enterobacter cloacae complex NOT DETECTED NOT DETECTED Final   Escherichia coli NOT DETECTED NOT DETECTED Final   Klebsiella oxytoca NOT DETECTED NOT DETECTED Final   Klebsiella pneumoniae NOT DETECTED NOT DETECTED Final   Proteus species NOT DETECTED NOT DETECTED Final   Serratia marcescens NOT DETECTED NOT DETECTED Final   Haemophilus influenzae NOT DETECTED NOT DETECTED Final   Neisseria meningitidis NOT DETECTED NOT DETECTED Final   Pseudomonas aeruginosa NOT DETECTED NOT DETECTED Final   Candida albicans DETECTED (A) NOT DETECTED Final    Comment: CRITICAL RESULT CALLED TO, READ BACK BY AND VERIFIED WITH: JC GRIMSLEY PHARMD AT 0445 BY AB    Candida glabrata NOT DETECTED NOT DETECTED Final   Candida krusei NOT DETECTED NOT DETECTED Final   Candida parapsilosis NOT DETECTED NOT DETECTED Final   Candida tropicalis NOT DETECTED NOT DETECTED Final    Comment: Performed at [facility], [address], [place], [place] [address]  Blood culture (routine x 2)     Status: Abnormal (Preliminary result)   Collection Time: 07/21/2017  8:44 PM  Result Value Ref Range Status   Specimen Description BLOOD LEFT FOREARM  Final   Special Requests IN PEDIATRIC BOTTLE Blood Culture adequate volume  Final   Culture  Setup Time (A)  Final    YEAST IN PEDIATRIC BOTTLE CRITICAL VALUE NOTED.  VALUE IS CONSISTENT WITH PREVIOUSLY REPORTED AND CALLED VALUE. Performed at [facility], [address] [place], [place] [address]    Culture YEAST (A)  Final   Report Status PENDING  Incomplete  MRSA PCR Screening     Status: None   Collection Time: 07/16/17  3:15 PM  Result Value Ref Range Status   MRSA by PCR NEGATIVE NEGATIVE Final  Comment:        The GeneXpert MRSA Assay (FDA approved for NASAL specimens only), is one component of a comprehensive MRSA colonization surveillance program. It is not intended to diagnose  MRSA infection nor to guide or monitor treatment for MRSA infections.      Studies: No results found.  Scheduled Meds: . allopurinol  100 mg Oral Daily  . aspirin EC  81 mg Oral Daily  . brimonidine  1 drop Right Eye TID  . dorzolamide-timolol  1 drop Both Eyes BID  . feeding supplement  1 Container Oral BID BM  . feeding supplement (ENSURE ENLIVE)  237 mL Oral BID BM  . Influenza vac split quadrivalent PF  0.5 mL Intramuscular Tomorrow-1000  . insulin aspart  0-5 Units Subcutaneous QHS  . insulin aspart  0-9 Units Subcutaneous TID WC  . latanoprost  1 drop Both Eyes QHS  . multivitamin with minerals  1 tablet Oral Daily  . pilocarpine  1 drop Right Eye TID  . pneumococcal 23 valent vaccine  0.5 mL Intramuscular Tomorrow-1000   Continuous Infusions: . cefTRIAXone (ROCEPHIN)  IV    . fluconazole (DIFLUCAN) IV 200 mg (07/18/17 0509)    Principal Problem:   Sepsis (HCC) Active Problems:   Type II diabetes mellitus with manifestations (HCC)   Anemia   UTI (urinary tract infection)   ARF (acute renal failure) (HCC)   Thrombocytopenia (HCC)   Hyponatremia    Time spent: >35 minutes     Esperanza Sheets  [facility] Pager [phone]. If 7PM-7AM, please contact night-coverage at [email or web address], password [facility] 07/18/2017, 8:21 AM  LOS: 3 days     Addendum: Diabetes Type II, poorly controlled with hyperglycemia. CKD IV.  Esperanza Sheets

## 2017-07-19 DIAGNOSIS — E871 Hypo-osmolality and hyponatremia: Secondary | ICD-10-CM

## 2017-07-19 DIAGNOSIS — N3001 Acute cystitis with hematuria: Secondary | ICD-10-CM

## 2017-07-19 DIAGNOSIS — D696 Thrombocytopenia, unspecified: Secondary | ICD-10-CM

## 2017-07-19 DIAGNOSIS — G9341 Metabolic encephalopathy: Secondary | ICD-10-CM

## 2017-07-19 DIAGNOSIS — A419 Sepsis, unspecified organism: Secondary | ICD-10-CM

## 2017-07-19 DIAGNOSIS — B49 Unspecified mycosis: Secondary | ICD-10-CM

## 2017-07-19 DIAGNOSIS — E118 Type 2 diabetes mellitus with unspecified complications: Secondary | ICD-10-CM

## 2017-07-19 DIAGNOSIS — N179 Acute kidney failure, unspecified: Secondary | ICD-10-CM

## 2017-07-19 LAB — BASIC METABOLIC PANEL
ANION GAP: 9 (ref 5–15)
BUN: 92 mg/dL — ABNORMAL HIGH (ref 6–20)
CALCIUM: 8.2 mg/dL — AB (ref 8.9–10.3)
CO2: 19 mmol/L — ABNORMAL LOW (ref 22–32)
CREATININE: 2.84 mg/dL — AB (ref 0.61–1.24)
Chloride: 119 mmol/L — ABNORMAL HIGH (ref 101–111)
GFR calc non Af Amer: 18 mL/min — ABNORMAL LOW (ref >60)
GFR, EST AFRICAN AMERICAN: 21 mL/min — AB (ref >60)
Glucose, Bld: 125 mg/dL — ABNORMAL HIGH (ref 65–99)
Potassium: 4.1 mmol/L (ref 3.5–5.1)
SODIUM: 147 mmol/L — AB (ref 135–145)

## 2017-07-19 LAB — RENAL FUNCTION PANEL
ANION GAP: 7 (ref 5–15)
Albumin: 1.6 g/dL — ABNORMAL LOW (ref 3.5–5.0)
BUN: 93 mg/dL — ABNORMAL HIGH (ref 6–20)
CALCIUM: 8.3 mg/dL — AB (ref 8.9–10.3)
CO2: 20 mmol/L — ABNORMAL LOW (ref 22–32)
Chloride: 120 mmol/L — ABNORMAL HIGH (ref 101–111)
Creatinine, Ser: 2.86 mg/dL — ABNORMAL HIGH (ref 0.61–1.24)
GFR calc Af Amer: 21 mL/min — ABNORMAL LOW (ref >60)
GFR, EST NON AFRICAN AMERICAN: 18 mL/min — AB (ref >60)
GLUCOSE: 124 mg/dL — AB (ref 65–99)
POTASSIUM: 4.3 mmol/L (ref 3.5–5.1)
Phosphorus: 3.8 mg/dL (ref 2.5–4.6)
SODIUM: 147 mmol/L — AB (ref 135–145)

## 2017-07-19 LAB — CBC
HCT: 31.5 % — ABNORMAL LOW (ref 39.0–52.0)
HEMOGLOBIN: 11.5 g/dL — AB (ref 13.0–17.0)
MCH: 36.4 pg — ABNORMAL HIGH (ref 26.0–34.0)
MCHC: 36.5 g/dL — AB (ref 30.0–36.0)
MCV: 99.7 fL (ref 78.0–100.0)
Platelets: 71 10*3/uL — ABNORMAL LOW (ref 150–400)
RBC: 3.16 MIL/uL — ABNORMAL LOW (ref 4.22–5.81)
RDW: 17.5 % — AB (ref 11.5–15.5)
WBC: 16.4 10*3/uL — AB (ref 4.0–10.5)

## 2017-07-19 LAB — CULTURE, BLOOD (ROUTINE X 2): SPECIAL REQUESTS: ADEQUATE

## 2017-07-19 LAB — GLUCOSE, CAPILLARY
GLUCOSE-CAPILLARY: 104 mg/dL — AB (ref 65–99)
GLUCOSE-CAPILLARY: 107 mg/dL — AB (ref 65–99)
GLUCOSE-CAPILLARY: 93 mg/dL (ref 65–99)
Glucose-Capillary: 90 mg/dL (ref 65–99)

## 2017-07-19 MED ORDER — SODIUM CHLORIDE 0.9 % IV SOLN
INTRAVENOUS | Status: DC
  Administered 2017-07-19: 17:00:00 via INTRAVENOUS

## 2017-07-19 MED ORDER — MORPHINE SULFATE (PF) 4 MG/ML IV SOLN
INTRAVENOUS | Status: AC
  Filled 2017-07-19: qty 1

## 2017-07-19 MED ORDER — SODIUM CHLORIDE 0.9 % IV BOLUS (SEPSIS)
1000.0000 mL | Freq: Once | INTRAVENOUS | Status: AC
  Administered 2017-07-19: 1000 mL via INTRAVENOUS

## 2017-07-19 MED ORDER — MORPHINE SULFATE (PF) 4 MG/ML IV SOLN
2.0000 mg | Freq: Once | INTRAVENOUS | Status: AC
  Administered 2017-07-19: 2 mg via INTRAVENOUS

## 2017-07-19 NOTE — Progress Notes (Signed)
Daily Progress Note   Patient Name: [name]       Date: 07/19/2017 DOB: 1926/06/08  Age: 84 y.o. MRN#: [id number] Attending Physician: Levie Heritage, DO Primary Care Physician: [name], MD Admit Date: August 08, 2017  Reason for Consultation/Follow-up: Establishing goals of care  Subjective:  patient continues to not be awake, is not alert Does not open eyes,does not verbalize,is not eating.  Son and daughter are at bedside, family meeting done again, see below  Length of Stay: 4  Current Medications: Scheduled Meds:  . allopurinol  100 mg Oral Daily  . aspirin EC  81 mg Oral Daily  . brimonidine  1 drop Right Eye TID  . dorzolamide-timolol  1 drop Both Eyes BID  . feeding supplement  1 Container Oral BID BM  . feeding supplement (ENSURE ENLIVE)  237 mL Oral BID BM  . Influenza vac split quadrivalent PF  0.5 mL Intramuscular Tomorrow-1000  . insulin aspart  0-5 Units Subcutaneous QHS  . insulin aspart  0-9 Units Subcutaneous TID WC  . latanoprost  1 drop Both Eyes QHS  . multivitamin with minerals  1 tablet Oral Daily  . pilocarpine  1 drop Right Eye TID  . pneumococcal 23 valent vaccine  0.5 mL Intramuscular Tomorrow-1000    Continuous Infusions: . cefTRIAXone (ROCEPHIN)  IV Stopped (07/18/17 1415)  . fluconazole (DIFLUCAN) IV Stopped (07/19/17 0601)    PRN Meds: acetaminophen **OR** acetaminophen, ondansetron (ZOFRAN) IV  Physical Exam         Elderly weak appearing gentleman resting in bed Not awake not alert Resting with eyes closed S1 S2 Shallow breath sounds, diminished at bases Has worsening 3rd spacing/edema especially noticeable in the LUE Has dark concentrated urine in foley bag Abdomen soft  Vital Signs: BP (!) 141/64   Pulse (!) 119   Temp  99.1 F (37.3 C) (Axillary)   Resp (!) 25   Ht 5\' 6"  (1.676 m)   Wt 83.5 kg (184 lb 1.4 oz)   SpO2 95%   BMI 29.71 kg/m  SpO2: SpO2: 95 % O2 Device: O2 Device: Not Delivered O2 Flow Rate:    Intake/output summary:   Intake/Output Summary (Last 24 hours) at 07/19/2017 1020 Last data filed at 07/19/2017 0400 Gross per 24 hour  Intake 130 ml  Output 850  ml  Net -720 ml   LBM: Last BM Date: 07/17/17 Baseline Weight: Weight: 87.3 kg (192 lb 7.4 oz) Most recent weight: Weight: 83.5 kg (184 lb 1.4 oz)       Palliative Assessment/Data: PPS 10%   Flowsheet Rows     Most Recent Value  Intake Tab  Referral Department  Hospitalist  Unit at Time of Referral  Intermediate Care Unit  Palliative Care Type  New Palliative care  Reason for referral  Clarify Goals of Care  Date first seen by Palliative Care  07/18/17  Clinical Assessment  Palliative Performance Scale Score  20%  Pain Max last 24 hours  4  Pain Min Last 24 hours  3  Dyspnea Max Last 24 Hours  4  Dyspnea Min Last 24 hours  3  Nausea Max Last 24 Hours  2  Nausea Min Last 24 Hours  1  Anxiety Max Last 24 Hours  4  Anxiety Min Last 24 Hours  3  Psychosocial & Spiritual Assessment  Palliative Care Outcomes  Patient/Family meeting held?  Yes  Who was at the meeting?  patient ( who is not awake/alert, unable to participate), son and daughter.   Palliative Care Outcomes  Clarified goals of care      Patient Active Problem List   Diagnosis Date Noted  . Encounter for palliative care   . Sepsis (HCC) 08/07/17  . UTI (urinary tract infection) 08/07/17  . ARF (acute renal failure) (HCC) 08-07-2017  . Thrombocytopenia (HCC) 08-07-17  . Hyponatremia 07-Aug-2017  . Midline low back pain without sciatica 12/19/2015  . Goals of care, counseling/discussion 12/19/2015  . Anemia 06/15/2015  . Tinea pedis of both feet 12/14/2014  . Vitamin D deficiency 02/12/2014  . Primary gout 02/11/2014  . Type II diabetes mellitus  with manifestations (HCC) 02/23/2012  . GLAUCOMA 09/07/2007  . Hyperlipidemia with target LDL less than 100 08/20/2007  . Essential hypertension 08/20/2007  . Osteoarthritis 08/20/2007    Palliative Care Assessment & Plan   Patient Profile:    Assessment:  sepsis UTI Fungemia Possible disseminated candida infection Acute renal failure Low serum albumin Thrombocytopenia.   Recommendations/Plan:   Another goals of care discussions/family meeting held with patient's son and daughter. The patient appears more weak, ongoing lethargy, not awake, not alert, not eating. His repeat blood cultures are pending. His serum creatinine results, his serum albumin levels and other pertinent blood work discussed with son and daughter in the room, in addition to his dark urine, worsening peripheral edema all discussed in detail.  Patient's son says that he feels a certain push from staff, to consider comfort care and to ship him off to hospice. He states that he recognizes that the patient is seriously ill but feels that certain aspects such as nutrition are not being addressed. He says, "what about a feeding tube?" We reviewed various aspects of artificial nutrition and hydration and whether or not they will help [place] and the goal being to avoid further harm. We reviewed code status again and he is agreeable to continue DNR DNI. We talked about risks of PEG, the patient might be too sick to undergo sedation and surgical procedure at this point. We discussed about NG tube and tube feedings. Risks and benefits about NG tube feedings done to the best of my ability, son would like to discuss further with [facility] MD. Dr Adrian Blackwater paged by me.   Re iterated to son that our goal was not to as quickly  as possible transition to comfort measures and resort to hospice, our goal was to provide support, to continue conversations that would help us realize the personhood behind the illnesses Mr Cherlynn PoloStaples is now facing. Offered  supportive care and active listening. All of the family's questions and concerns addressed to the best of my ability, Dr Adrian BlackwaterStinson paged at family's request.     Code Status:    Code Status Orders  (From admission, onward)        Start     Ordered   07/18/17 1232  Do not attempt resuscitation (DNR)  Continuous    Question Answer Comment  In the event of cardiac or respiratory ARREST Do not call a "code blue"   In the event of cardiac or respiratory ARREST Do not perform Intubation, CPR, defibrillation or ACLS   In the event of cardiac or respiratory ARREST Use medication by any route, position, wound care, and other measures to relive pain and suffering. May use oxygen, suction and manual treatment of airway obstruction as needed for comfort.      07/18/17 1232    Code Status History    Date Active Date Inactive Code Status Order ID Comments User Context   07/16/2017 01:09 07/18/2017 12:32 Full Code [id number]  Pearson GrippeKim, James, MD ED    Advance Directive Documentation     Most Recent Value  Type of Advance Directive  Healthcare Power of Attorney, Living will  Pre-existing out of facility DNR order (yellow form or pink MOST form)  No data  "MOST" Form in Place?  No data       Prognosis:   < 2 weeks, in my opinion.   Discharge Planning:  To Be Determined (anticipated hospital death versus hospice facility transfer for comfort care, frankly discussed with family) Care plan was discussed with  Son and daughter who are at bedside.   Thank you for allowing the Palliative Medicine Team to assist in the care of this patient.   Time In:  9030 Time Out: 1020 Total Time 50 min  Prolonged Time Billed  yes       Greater than 50%  of this time was spent counseling and coordinating care related to the above assessment and plan.  Rosalin HawkingZeba Davida Falconi, MD [phone] Please contact Palliative Medicine Team phone at [phone] for questions and concerns.

## 2017-07-19 NOTE — Progress Notes (Signed)
OT Cancellation Note  Patient Details Name: [name] MRN: [id number] DOB: Feb 11, 1926   Cancelled Treatment:    Reason Eval/Treat Not Completed: Other (comment).  Pt is not alert, not following commands per notes. Will sign off.    Arlon Bleier 07/19/2017, 11:07 AM  Marica OtterMaryellen Tondra Reierson, OTR/L [phone] 07/19/2017

## 2017-07-19 NOTE — Progress Notes (Signed)
[facility] PROGRESS NOTE  [name] ZOX:[id number] DOB: 02-15-1926 DOA: 2017/07/23 PCP: [name], MD  Brief Summary: 84 year old male with history of type 2 diabetes, hypertension, gout presented with generalized weakness. Patient was recently treated with Keflex for UTI. Patient with urinary incontinence, dysuria urgency. He lives by himself. Admitted for UTI, acute kidney injury and sepsis. Examination consistent with bladder distention therefore Foley catheter was inserted on 1/7, drained 2600 cc of urine which was cloudy.  Assessment/Plan: Principal Problem:   Sepsis (HCC) Active Problems:   Type II diabetes mellitus with manifestations (HCC)   Anemia   Goals of care, counseling/discussion   UTI (urinary tract infection)   ARF (acute renal failure) (HCC)   Thrombocytopenia (HCC)   Hyponatremia   Encounter for palliative care   Fungemia   Acute metabolic encephalopathy   1. Sepsis  Continues to minimally improve  White counts have not improved -currently 16.4  Repeat blood cultures yesterday. 2. Fungemia  Repeat blood culture yesterday - will follow    on fluconazole 3. UTI  Candida on culture  On fluconazole  Stop rocephin 4. Type 2 diabetes  CBG controlled  Continue sliding scale insulin 5. Acute renal injury  Creatinine 2.86 today  Urine output: 850 mL  1L IVF with continued IVF 6. Acute metabolic encephalopathy  Code Status: DNR Family Communication: discussed pt with family members  Disposition Plan: pending   Consultants:  Infectious disease  Palliative Care  Procedures:  None  Antibiotics:  Fluconazole  HPI/Subjective: Pt somnolent  Objective: Vitals:   07/19/17 0600 07/19/17 0800  BP:    Pulse: (!) 119   Resp: (!) 25   Temp:  99.1 F (37.3 C)  SpO2:      Intake/Output Summary (Last 24 hours) at 07/19/2017 1103 Last data filed at 07/19/2017 0400 Gross per 24 hour  Intake 120 ml  Output 850 ml    Net -730 ml   Filed Weights   07/16/17 0452 07/17/17 0500 07/19/17 0450  Weight: 87.3 kg (192 lb 7.4 oz) 87.4 kg (192 lb 10.9 oz) 83.5 kg (184 lb 1.4 oz)    Exam:   General:  somnolent  Cardiovascular: RR, Nl S1/S2, no murmur  Respiratory: CTA b/L  Abdomen: soft, nondistended  Musculoskeletal:   Extremity: no edema, 2+ pulses.   Data Reviewed: Basic Metabolic Panel: Recent Labs  Lab Jul 23, 2017 2054 07/16/17 0129 07/16/17 0725 07/17/17 0243 07/18/17 0244 07/19/17 0254  NA 133*  --  131* 135 142 147*  147*  K 5.0  --  5.0 3.9 3.6 4.3  4.1  CL 100*  --  103 111 116* 120*  119*  CO2  --   --  16* 16* 20* 20*  19*  GLUCOSE 210*  --  183* 209* 129* 124*  125*  BUN 62*  --  82* 81* 78* 93*  92*  CREATININE 3.80* 3.71* 3.95* 3.01* 2.51* 2.86*  2.84*  CALCIUM  --   --  8.3* 7.6* 8.2* 8.3*  8.2*  PHOS  --   --   --  4.2 3.7 3.8   Liver Function Tests: Recent Labs  Lab July 23, 2017 2015 07/16/17 0725 07/17/17 0243 07/18/17 0244 07/19/17 0254  AST 67* 62*  --   --   --   ALT 35 33  --   --   --   ALKPHOS 136* 126  --   --   --   BILITOT 4.2* 3.7*  --   --   --  PROT 8.4* 7.1  --   --   --   ALBUMIN 2.4* 2.1* 1.7* 1.7* 1.6*   Recent Labs  Lab Jul 28, 2017 2015  LIPASE 41   No results for input(s): AMMONIA in the last 168 hours. CBC: Recent Labs  Lab 07-28-2017 1816 07/28/17 2054 07/16/17 0725 07/17/17 0243 07/18/17 0244 07/19/17 0254  WBC 16.4*  --  20.4* 17.4* 16.8* 16.4*  HGB 12.3* 12.9* 11.8* 9.9* 11.5* 11.5*  HCT 33.7* 38.0* 32.7* 26.9* 31.4* 31.5*  MCV 101.2*  --  100.6* 99.6 99.1 99.7  PLT 122*  --  114* 96* 90* 71*   Cardiac Enzymes: Recent Labs  Lab 07/16/17 0129 07/16/17 0725 07/16/17 1252  TROPONINI 0.05* 0.05* 0.06*   BNP (last 3 results) No results for input(s): BNP in the last 8760 hours.  ProBNP (last 3 results) No results for input(s): PROBNP in the last 8760 hours.  CBG: Recent Labs  Lab 07/18/17 0828 07/18/17 1325  07/18/17 1626 07/18/17 2122 07/19/17 0804  GLUCAP 123* 126* 109* 129* 104*    Recent Results (from the past 240 hour(s))  Urine culture     Status: Abnormal   Collection Time: 07/09/17 12:31 PM  Result Value Ref Range Status   Specimen Description URINE, CLEAN CATCH  Final   Special Requests NONE  Final   Culture MULTIPLE SPECIES PRESENT, SUGGEST RECOLLECTION (A)  Final   Report Status 07/10/2017 FINAL  Final  Urine culture     Status: Abnormal   Collection Time: 07/28/17  7:48 PM  Result Value Ref Range Status   Specimen Description URINE, RANDOM  Final   Special Requests NONE  Final   Culture >=100,000 COLONIES/mL YEAST (A)  Final   Report Status 07/17/2017 FINAL  Final  Blood culture (routine x 2)     Status: Abnormal   Collection Time: 2017/07/28  8:15 PM  Result Value Ref Range Status   Specimen Description BLOOD RIGHT FOREARM  Final   Special Requests   Final    BOTTLES DRAWN AEROBIC AND ANAEROBIC Blood Culture adequate volume   Culture  Setup Time   Final    YEAST AEROBIC BOTTLE ONLY CRITICAL RESULT CALLED TO, READ BACK BY AND VERIFIED WITHPeggyann Juba [facility] 4782 07/17/17 A BROWNING Performed at [facility], [address] [place], [place] [address]    Culture CANDIDA ALBICANS (A)  Final   Report Status 07/18/2017 FINAL  Final  Blood Culture ID Panel (Reflexed)     Status: Abnormal   Collection Time: Jul 28, 2017  8:15 PM  Result Value Ref Range Status   Enterococcus species NOT DETECTED NOT DETECTED Final   Listeria monocytogenes NOT DETECTED NOT DETECTED Final   Staphylococcus species NOT DETECTED NOT DETECTED Final   Staphylococcus aureus NOT DETECTED NOT DETECTED Final   Streptococcus species NOT DETECTED NOT DETECTED Final   Streptococcus agalactiae NOT DETECTED NOT DETECTED Final   Streptococcus pneumoniae NOT DETECTED NOT DETECTED Final   Streptococcus pyogenes NOT DETECTED NOT DETECTED Final   Acinetobacter baumannii NOT DETECTED NOT DETECTED Final    Enterobacteriaceae species NOT DETECTED NOT DETECTED Final   Enterobacter cloacae complex NOT DETECTED NOT DETECTED Final   Escherichia coli NOT DETECTED NOT DETECTED Final   Klebsiella oxytoca NOT DETECTED NOT DETECTED Final   Klebsiella pneumoniae NOT DETECTED NOT DETECTED Final   Proteus species NOT DETECTED NOT DETECTED Final   Serratia marcescens NOT DETECTED NOT DETECTED Final   Haemophilus influenzae NOT DETECTED NOT DETECTED Final  Neisseria meningitidis NOT DETECTED NOT DETECTED Final   Pseudomonas aeruginosa NOT DETECTED NOT DETECTED Final   Candida albicans DETECTED (A) NOT DETECTED Final    Comment: CRITICAL RESULT CALLED TO, READ BACK BY AND VERIFIED WITH: JC GRIMSLEY PHARMD AT 0445 BY AB    Candida glabrata NOT DETECTED NOT DETECTED Final   Candida krusei NOT DETECTED NOT DETECTED Final   Candida parapsilosis NOT DETECTED NOT DETECTED Final   Candida tropicalis NOT DETECTED NOT DETECTED Final    Comment: Performed at [facility], [address] [place], [place] [address]  Blood culture (routine x 2)     Status: Abnormal   Collection Time: 2017-07-26  8:44 PM  Result Value Ref Range Status   Specimen Description BLOOD LEFT FOREARM  Final   Special Requests IN PEDIATRIC BOTTLE Blood Culture adequate volume  Final   Culture  Setup Time (A)  Final    YEAST IN PEDIATRIC BOTTLE CRITICAL VALUE NOTED.  VALUE IS CONSISTENT WITH PREVIOUSLY REPORTED AND CALLED VALUE. Performed at [facility], [address], [place], [place] [address]    Culture CANDIDA ALBICANS (A)  Final   Report Status 07/19/2017 FINAL  Final  MRSA PCR Screening     Status: None   Collection Time: 07/16/17  3:15 PM  Result Value Ref Range Status   MRSA by PCR NEGATIVE NEGATIVE Final    Comment:        The GeneXpert MRSA Assay (FDA approved for NASAL specimens only), is one component of a comprehensive MRSA colonization surveillance program. It is not intended to diagnose MRSA infection  nor to guide or monitor treatment for MRSA infections.      Studies: Ct Head Wo Contrast  Result Date: 07/18/2017 CLINICAL DATA:  Metabolic encephalopathy with altered mental status EXAM: CT HEAD WITHOUT CONTRAST TECHNIQUE: Contiguous axial images were obtained from the base of the skull through the vertex without intravenous contrast. COMPARISON:  None. FINDINGS: Brain: There is moderate diffuse atrophy. There is no intracranial mass, hemorrhage, extra-axial fluid collection, or midline shift. There is widespread small vessel disease throughout the centra semiovale bilaterally. There is small vessel disease in each internal capsule. No acute appearing infarct evident. Vascular: There is no appreciable hyperdense vessel. There is calcification in each carotid siphon region. Skull: The bony calvarium appears intact. Sinuses/Orbits: There is opacification in both maxillary antra. There is mucosal thickening in several ethmoid air cells bilaterally. Other paranasal sinuses are clear. Orbits appear symmetric bilaterally. Other: Mastoid air cells are clear. IMPRESSION: Atrophy with extensive supratentorial small vessel disease. No evident acute infarct. No mass or hemorrhage. There are foci of arterial vascular calcification. There are multiple foci of paranasal sinus disease. Electronically Signed   By: Bretta Bang III M.D.   On: 07/18/2017 11:21    Scheduled Meds: . allopurinol  100 mg Oral Daily  . aspirin EC  81 mg Oral Daily  . brimonidine  1 drop Right Eye TID  . dorzolamide-timolol  1 drop Both Eyes BID  . feeding supplement  1 Container Oral BID BM  . feeding supplement (ENSURE ENLIVE)  237 mL Oral BID BM  . Influenza vac split quadrivalent PF  0.5 mL Intramuscular Tomorrow-1000  . insulin aspart  0-5 Units Subcutaneous QHS  . insulin aspart  0-9 Units Subcutaneous TID WC  . latanoprost  1 drop Both Eyes QHS  . multivitamin with minerals  1 tablet Oral Daily  . pilocarpine  1 drop  Right  Eye TID  . pneumococcal 23 valent vaccine  0.5 mL Intramuscular Tomorrow-1000   Continuous Infusions: . cefTRIAXone (ROCEPHIN)  IV Stopped (07/18/17 1415)  . fluconazole (DIFLUCAN) IV Stopped (07/19/17 0601)      Time spent: 25 minutes    Levie HeritageJacob J Stinson  [facility] Pager: [phone] 07/19/2017, 11:03 AM  LOS: 4 days

## 2017-07-19 NOTE — Progress Notes (Signed)
[facility]    Date of Admission:  2017/08/01   Total days of antibiotics 5        Day 4 fluconazole          ID: [name] is a 84 y.o. male presents with sepsis 2/2 candidal uti with secondary fungemia Principal Problem:   Sepsis (HCC) Active Problems:   Type II diabetes mellitus with manifestations (HCC)   Anemia   Goals of care, counseling/discussion   UTI (urinary tract infection)   ARF (acute renal failure) (HCC)   Thrombocytopenia (HCC)   Hyponatremia   Encounter for palliative care   Fungemia   Acute metabolic encephalopathy    Subjective: Had isolated 100.3 F last night Still not responding much to command, son reports he moves his right arm and legs and opens eyes more often today but not following commands or responding to questions  Medications:  . allopurinol  100 mg Oral Daily  . aspirin EC  81 mg Oral Daily  . brimonidine  1 drop Right Eye TID  . dorzolamide-timolol  1 drop Both Eyes BID  . feeding supplement  1 Container Oral BID BM  . feeding supplement (ENSURE ENLIVE)  237 mL Oral BID BM  . insulin aspart  0-5 Units Subcutaneous QHS  . insulin aspart  0-9 Units Subcutaneous TID WC  . latanoprost  1 drop Both Eyes QHS  . multivitamin with minerals  1 tablet Oral Daily  . pilocarpine  1 drop Right Eye TID    Objective: Vital signs in last 24 hours: Temp:  [98.7 F (37.1 C)-100.3 F (37.9 C)] 99.1 F (37.3 C) (01/10 1200) Pulse Rate:  [109-119] 109 (01/10 1400) Resp:  [21-29] 23 (01/10 1400) BP: (116-160)/(36-80) 118/55 (01/10 1400) SpO2:  [95 %-98 %] 95 % (01/10 1400) Weight:  [184 lb 1.4 oz (83.5 kg)] 184 lb 1.4 oz (83.5 kg) (01/10 0450) Physical Exam  Constitutional: sleeping. He appears chronically ill. No distress.  HENT:  Mouth/Throat: Oropharynx is dry. Poor dentition. Missing majority of teeth. Cardiovascular: Normal rate, regular rhythm and normal heart sounds. Exam reveals no gallop and no friction rub.   No murmur heard.  Pulmonary/Chest: Effort normal and breath sounds normal. No respiratory distress. He has no wheezes.  Abdominal: Soft. Bowel sounds are minimal. He exhibits mild distension.no tenderness to mild palpation Skin: Skin is warm and dry. No rash noted. No erythema.  Psychiatric: He has a normal mood and affect. His behavior is normal.     Lab Results Recent Labs    07/18/17 0244 07/19/17 0254  WBC 16.8* 16.4*  HGB 11.5* 11.5*  HCT 31.4* 31.5*  NA 142 147*  147*  K 3.6 4.3  4.1  CL 116* 120*  119*  CO2 20* 20*  19*  BUN 78* 93*  92*  CREATININE 2.51* 2.86*  2.84*   Liver Panel Recent Labs    07/18/17 0244 07/19/17 0254  ALBUMIN 1.7* 1.6*    Microbiology: 1/6 blood cx c.albicans 1/6 urine cx yeast Studies/Results: Ct Head Wo Contrast  Result Date: 07/18/2017 CLINICAL DATA:  Metabolic encephalopathy with altered mental status EXAM: CT HEAD WITHOUT CONTRAST TECHNIQUE: Contiguous axial images were obtained from the base of the skull through the vertex without intravenous contrast. COMPARISON:  None. FINDINGS: Brain: There is moderate diffuse atrophy. There is no intracranial mass, hemorrhage, extra-axial fluid collection, or midline shift. There is widespread small vessel disease throughout the centra semiovale bilaterally. There is small  vessel disease in each internal capsule. No acute appearing infarct evident. Vascular: There is no appreciable hyperdense vessel. There is calcification in each carotid siphon region. Skull: The bony calvarium appears intact. Sinuses/Orbits: There is opacification in both maxillary antra. There is mucosal thickening in several ethmoid air cells bilaterally. Other paranasal sinuses are clear. Orbits appear symmetric bilaterally. Other: Mastoid air cells are clear. IMPRESSION: Atrophy with extensive supratentorial small vessel disease. No evident acute infarct. No mass or hemorrhage. There are foci of arterial vascular  calcification. There are multiple foci of paranasal sinus disease. Electronically Signed   By: Bretta BangWilliam  Woodruff III M.D.   On: 07/18/2017 11:21     Assessment/Plan: Fungemia = continue on fluconazole, renally dosed. repeat blood cultures are ngtd at 24hr. If he clinically improves, recommend to get ophtho to evaluate for endolphalmitis  aki on ckd = appears unchanged yesterday, recommend tryntomgive a liter of NS to see if any improvement. I suspect element of pre-renal, insensible loss. Question if elevated BUN contributes to AMS  uti = continue on fluconazole for candidal uti, likely ascending infection causing his bacteremia  Abdominal discomfort = possibly has ileus, if clinical exam worsens, recommend KUB.  Overall prognosis appears poor but would still give another day or 2 to see if he has response to therapy  Family appears concerned that he is not receiving iv therapy, I reviewed with his son that his dad received his iv abtx at 5 am  [facility]ynthia Miche[facility] Cell: [phone] Pager: [phone]  07/19/2017, 4:56 PM

## 2017-07-19 NOTE — Progress Notes (Signed)
Physical Therapy Discharge Patient Details Name: [name] MRN: [id number] DOB: 1926-04-17 Today's Date: 07/19/2017 Time:  -     Patient discharged from PT services secondary to medical decline - will need to re-order PT to resume therapy services.  Please see latest therapy progress note for current level of functioning and progress toward goals.     GP     Rada HayHill, Saxton Chain [name] 07/19/2017, 7:44 AM

## 2017-07-20 ENCOUNTER — Inpatient Hospital Stay (HOSPITAL_COMMUNITY): Payer: Medicare Other

## 2017-07-20 DIAGNOSIS — R10817 Generalized abdominal tenderness: Secondary | ICD-10-CM

## 2017-07-20 DIAGNOSIS — Z515 Encounter for palliative care: Secondary | ICD-10-CM

## 2017-07-20 LAB — GLUCOSE, CAPILLARY
GLUCOSE-CAPILLARY: 79 mg/dL (ref 65–99)
Glucose-Capillary: 72 mg/dL (ref 65–99)
Glucose-Capillary: 86 mg/dL (ref 65–99)
Glucose-Capillary: 86 mg/dL (ref 65–99)

## 2017-07-20 LAB — BASIC METABOLIC PANEL
ANION GAP: 9 (ref 5–15)
ANION GAP: 6 (ref 5–15)
BUN: 97 mg/dL — ABNORMAL HIGH (ref 6–20)
BUN: 111 mg/dL — ABNORMAL HIGH (ref 6–20)
CALCIUM: 8.2 mg/dL — AB (ref 8.9–10.3)
CALCIUM: 8 mg/dL — AB (ref 8.9–10.3)
CO2: 16 mmol/L — AB (ref 22–32)
CO2: 18 mmol/L — AB (ref 22–32)
Chloride: 124 mmol/L — ABNORMAL HIGH (ref 101–111)
Chloride: 124 mmol/L — ABNORMAL HIGH (ref 101–111)
Creatinine, Ser: 3.68 mg/dL — ABNORMAL HIGH (ref 0.61–1.24)
Creatinine, Ser: 3.94 mg/dL — ABNORMAL HIGH (ref 0.61–1.24)
GFR calc Af Amer: 15 mL/min — ABNORMAL LOW (ref >60)
GFR calc non Af Amer: 12 mL/min — ABNORMAL LOW (ref >60)
GFR, EST AFRICAN AMERICAN: 14 mL/min — AB (ref >60)
GFR, EST NON AFRICAN AMERICAN: 13 mL/min — AB (ref >60)
Glucose, Bld: 89 mg/dL (ref 65–99)
Glucose, Bld: 103 mg/dL — ABNORMAL HIGH (ref 65–99)
POTASSIUM: 5 mmol/L (ref 3.5–5.1)
Potassium: 5.1 mmol/L (ref 3.5–5.1)
Sodium: 149 mmol/L — ABNORMAL HIGH (ref 135–145)
Sodium: 148 mmol/L — ABNORMAL HIGH (ref 135–145)

## 2017-07-20 LAB — CBC
HEMATOCRIT: 33.9 % — AB (ref 39.0–52.0)
Hemoglobin: 12.1 g/dL — ABNORMAL LOW (ref 13.0–17.0)
MCH: 36.3 pg — ABNORMAL HIGH (ref 26.0–34.0)
MCHC: 35.7 g/dL (ref 30.0–36.0)
MCV: 101.8 fL — ABNORMAL HIGH (ref 78.0–100.0)
Platelets: 52 10*3/uL — ABNORMAL LOW (ref 150–400)
RBC: 3.33 MIL/uL — AB (ref 4.22–5.81)
RDW: 18.1 % — AB (ref 11.5–15.5)
WBC: 14.8 10*3/uL — AB (ref 4.0–10.5)

## 2017-07-20 LAB — PHOSPHORUS: PHOSPHORUS: 4.9 mg/dL — AB (ref 2.5–4.6)

## 2017-07-20 LAB — ALBUMIN: Albumin: 1.6 g/dL — ABNORMAL LOW (ref 3.5–5.0)

## 2017-07-20 MED ORDER — SODIUM CHLORIDE 0.45 % IV SOLN
INTRAVENOUS | Status: DC
  Administered 2017-07-20 – 2017-07-22 (×5): via INTRAVENOUS

## 2017-07-20 MED ORDER — MORPHINE SULFATE (PF) 4 MG/ML IV SOLN
2.0000 mg | INTRAVENOUS | Status: DC | PRN
  Administered 2017-07-20 – 2017-07-22 (×7): 2 mg via INTRAVENOUS
  Filled 2017-07-20 (×7): qty 1

## 2017-07-20 MED ORDER — BISACODYL 10 MG RE SUPP
10.0000 mg | Freq: Every day | RECTAL | Status: DC | PRN
  Administered 2017-07-20: 10 mg via RECTAL
  Filled 2017-07-20: qty 1

## 2017-07-20 MED ORDER — FUROSEMIDE 10 MG/ML IJ SOLN
60.0000 mg | Freq: Once | INTRAMUSCULAR | Status: AC
  Administered 2017-07-20: 60 mg via INTRAVENOUS
  Filled 2017-07-20: qty 6

## 2017-07-20 MED ORDER — SODIUM CHLORIDE 0.45 % IV BOLUS
1000.0000 mL | Freq: Once | INTRAVENOUS | Status: AC
  Administered 2017-07-20: 1000 mL via INTRAVENOUS

## 2017-07-20 NOTE — Progress Notes (Signed)
Pharmacy Antibiotic Note  [name] is a 84 y.o. male admitted on 07/29/2017 with Candida albicans in blood.  Pharmacy has been consulted for Fluconazole dosing.  Plan: Continue Fluconazole 400mg  iv x1, then 200mg  iv q24hr (50% dose reduction due to renal function) -f/u ID's recs   Height: 5\' 6"  (167.6 cm) Weight: 184 lb 15.5 oz (83.9 kg) IBW/kg (Calculated) : 63.8  Temp (24hrs), Avg:98.2 F (36.8 C), Min:97.2 F (36.2 C), Max:99.1 F (37.3 C)  Recent Labs  Lab 10/16/17 2054 10/16/17 2158  07/16/17 0725 07/16/17 1252 07/16/17 1609 07/17/17 0243 07/18/17 0244 07/19/17 0254 07/20/17 0233  WBC  --   --   --  20.4*  --   --  17.4* 16.8* 16.4* 14.8*  CREATININE 3.80*  --    < > 3.95*  --   --  3.01* 2.51* 2.86*  2.84* 3.68*  LATICACIDVEN 5.63* 5.26*  --   --  5.8* 5.0* 2.8*  --   --   --    < > = values in this interval not displayed.    Estimated Creatinine Clearance: 13.8 mL/min (A) (by C-G formula based on SCr of 3.68 mg/dL (H)).    Allergies  Allergen Reactions  . Iodine     REACTION: rash---whleps  . Dipivefrin Rash  . Methazolamide Rash  . Metoclopramide Rash    Hives    Antimicrobials this admission: 1/6 Zosyn x 1  1/6 Ceftriaxone >> 1/10 1/8 fluconazole >>  Dose adjustments this admission: -  Microbiology results: pending  Thank you for allowing pharmacy to be a part of this patient's care.  Maurice MarchJackson, Lolita Faulds E 07/20/2017 9:08 AM

## 2017-07-20 NOTE — Progress Notes (Signed)
Date:  July 20, 2017 Chart reviewed for concurrent status and case management needs.  Will continue to follow patient progress.  Discharge Planning: following for needs.  None present at this time of review. Expected discharge date: July 23 2017 Marcelle SmilingRhonda Davis, BSN, [place] ViewRN3, [place]CC[place]   [phone]

## 2017-07-20 NOTE — Progress Notes (Signed)
The patient was turned Q2H from 0700-1900.

## 2017-07-20 NOTE — Progress Notes (Signed)
[facility]    Date of Admission:  08/07/2017   Total days of antibiotics 6        Day 5 fluconazole          ID: Maralyn SagoClyde Evans is a 84 y.o. male with  Fungemia secondary to candidal uti c/b AKI, encephalopathy Principal Problem:   Sepsis (HCC) Active Problems:   Type II diabetes mellitus with manifestations (HCC)   Anemia   Goals of care, counseling/discussion   UTI (urinary tract infection)   ARF (acute renal failure) (HCC)   Thrombocytopenia (HCC)   Hyponatremia   Encounter for palliative care   Fungemia   Acute metabolic encephalopathy    Subjective: Afebrile, but has only produced 600mL UOP in 24hr. Still not responding to verbal commands  Medications:  . allopurinol  100 mg Oral Daily  . aspirin EC  81 mg Oral Daily  . brimonidine  1 drop Right Eye TID  . dorzolamide-timolol  1 drop Both Eyes BID  . feeding supplement  1 Container Oral BID BM  . feeding supplement (ENSURE ENLIVE)  237 mL Oral BID BM  . insulin aspart  0-5 Units Subcutaneous QHS  . insulin aspart  0-9 Units Subcutaneous TID WC  . latanoprost  1 drop Both Eyes QHS  . multivitamin with minerals  1 tablet Oral Daily  . pilocarpine  1 drop Right Eye TID    Objective: Vital signs in last 24 hours: Temp:  [97.2 F (36.2 C)-99.1 F (37.3 C)] 97.2 F (36.2 C) (01/11 0800) Pulse Rate:  [54-119] 103 (01/11 0600) Resp:  [18-29] 19 (01/11 0600) BP: (101-166)/(47-70) 109/52 (01/11 0600) SpO2:  [94 %-97 %] 96 % (01/11 0600) Weight:  [184 lb 15.5 oz (83.9 kg)] 184 lb 15.5 oz (83.9 kg) (01/11 0419) Physical Exam  Constitutional: He remains to have his eyes shut. He moves his head from side to side. No distress.  HENT:  Mouth/Throat: Oropharynx is dry with exudate on tongue and soft palate Cardiovascular: Normal rate, regular rhythm and normal heart sounds. Exam reveals no gallop and no friction rub.  No murmur heard.  Pulmonary/Chest: Effort normal and breath sounds normal.  No respiratory distress. He has no wheezes.  Abdominal: Soft. Bowel sounds are decreased.He exhibits no distension. There is no tenderness.  Ext = 1+ edema Skin: Skin is warm and dry. No rash noted. No erythema.  Psychiatric: unable to assess    Lab Results Recent Labs    07/19/17 0254 07/20/17 0233  WBC 16.4* 14.8*  HGB 11.5* 12.1*  HCT 31.5* 33.9*  NA 147*  147* 149*  K 4.3  4.1 5.1  CL 120*  119* 124*  CO2 20*  19* 16*  BUN 93*  92* 97*  CREATININE 2.86*  2.84* 3.68*   Liver Panel Recent Labs    07/19/17 0254 07/20/17 0233  ALBUMIN 1.6* 1.6*    Microbiology: Blood cx on 1/9 continues to be NGTD at 48hrs Studies/Results: Dg Abd Portable 1v  Result Date: 07/20/2017 CLINICAL DATA:  Abdominal tenderness and distension EXAM: PORTABLE ABDOMEN - 1 VIEW COMPARISON:  None. FINDINGS: Scattered large and small bowel gas is noted. No definitive obstructive changes are seen this likely represents a generalized ileus. Small amount of fecal material is noted within the rectum. Degenerative changes of the thoracolumbar spine are noted with scoliosis concave to the right. IMPRESSION: Changes most consistent with a colonic ileus. Correlation with the physical exam is recommended. Electronically Signed  By: Alcide Clever M.D.   On: 07/20/2017 11:18     Assessment/Plan: Fungemia = continue with fluconazole, renally dosed.  Leukocytosis = improving, continue with fluconazole  aki worsening, = insensible losses, still appears pre-renal. Agree with plan to give more IVF, consider 40-60mg  IV lasix to see if any response. Recommend to get renal consultation  Hyperkalemia = likely corresponds to [facility], consider giving lasix. Stable today at 5  Encephalopathy = likely multifactorial, infection vs. AKI/elevated BUN.   Prognosis remains poor given he has not improved significantly since admit and having worsening renal failure  Dr Orvan Falconer to see this weekend  [facility] Cell: [phone] Pager: [phone]  07/20/2017, 11:29 AM

## 2017-07-20 NOTE — Progress Notes (Signed)
PMT progress note  Patient is resting in bed, his eyes are open, seems some what more awake than before. There is no family at the bedside currently.   BP (!) 117/52   Pulse (!) 103   Temp 97.6 F (36.4 C) (Axillary)   Resp 17   Ht 5\' 6"  (1.676 m)   Wt 83.9 kg (184 lb 15.5 oz)   SpO2 97%   BMI 29.85 kg/m  Pertinent labs, imaging and chart reviewed.   Resting in bed Opens eyes, does not verbalize Moving extremities spontaneously Winces when attempted touching  Sepsis Fungemia UTI DM Acute metabolic encephalopathy Gradual progressive decline, ongoing, prior to this current hospitalization.   PMT continues to follow, agree with current mode of care, no additional palliative specific recommendations at this time, will continue to monitor trajectory and help assist the family in decision making and disposition planning as feasible.   15 minutes spent Rosalin HawkingZeba Steven Basso  MD [facility] [phone]  [phone]

## 2017-07-20 NOTE — Progress Notes (Signed)
[facility] PROGRESS NOTE  [name] ZOX:[id number] DOB: February 17, 1926 DOA: 13-Aug-2017 PCP: [name], MD  Brief Summary: 84 year old male with history of type 2 diabetes, hypertension, gout presented with generalized weakness. Patient was recently treated with Keflex for UTI. Patient with urinary incontinence, dysuria urgency. He lives by himself. Admitted for UTI, acute kidney injury and sepsis. Examination consistent with bladder distention therefore Foley catheter was inserted on 1/7, drained 2600 cc of urine which was cloudy.  Assessment/Plan: Principal Problem:   Sepsis (HCC) Active Problems:   Type II diabetes mellitus with manifestations (HCC)   Anemia   Goals of care, counseling/discussion   UTI (urinary tract infection)   ARF (acute renal failure) (HCC)   Thrombocytopenia (HCC)   Hyponatremia   Encounter for palliative care   Fungemia   Acute metabolic encephalopathy   1. Sepsis  Mild improvement of white counts - currently 14.8  Repeat blood cultures yesterday. 2. Fungemia  Repeat blood culture yesterday - will follow   on fluconazole - dose per pharmacy 3. UTI  Candida on culture  On fluconazole  Stop rocephin 4. Type 2 diabetes  CBG controlled  Continue sliding scale insulin 5. Acute renal injury  Creatinine 3.68 today  Urine output: 600 mL in last 24 hours.  1L IVF bolus with continued IVF - change to 0.45 NS  Repeat BMP this afternoon.  If Cr still elevated, consider lasix 6. Acute metabolic encephalopathy  Mild improvement - appears more aggitated today, although no purposeful movement.  KUB to RO ileus  Morphine for pain  Code Status: DNR Family Communication: discussed pt with family members  Disposition Plan: pending   Consultants:  Infectious disease  Palliative Care  Procedures:  None  Antibiotics:  Fluconazole  HPI/Subjective: Pt somnolent, but appearing more aggitated. Per nursing, no acute  events overnight. No fevers, chills.  Objective: Vitals:   07/20/17 0600 07/20/17 0800  BP: (!) 109/52   Pulse: (!) 103   Resp: 19   Temp:  (!) 97.2 F (36.2 C)  SpO2: 96%     Intake/Output Summary (Last 24 hours) at 07/20/2017 0922 Last data filed at 07/20/2017 0600 Gross per 24 hour  Intake 288.33 ml  Output 600 ml  Net -311.67 ml   Filed Weights   07/17/17 0500 07/19/17 0450 07/20/17 0419  Weight: 87.4 kg (192 lb 10.9 oz) 83.5 kg (184 lb 1.4 oz) 83.9 kg (184 lb 15.5 oz)    Exam:   General:  Somnolent, but aggitated. No purposeful movement.  Cardiovascular: regular rate.  Normal S1-S2 sounds.  No murmurs.   Respiratory:  clear to auscultation bilaterally.  No wheezes, rales  Abdomen: Soft, nondistended.  Possibly mildly tender as patient grimaces with palpation   Musculoskeletal: No purposeful movements  Extremity: Extremities warm to touch.  No edema.  2+ pulses  Data Reviewed: Basic Metabolic Panel: Recent Labs  Lab 07/16/17 0725 07/17/17 0243 07/18/17 0244 07/19/17 0254 07/20/17 0233  NA 131* 135 142 147*  147* 149*  K 5.0 3.9 3.6 4.3  4.1 5.1  CL 103 111 116* 120*  119* 124*  CO2 16* 16* 20* 20*  19* 16*  GLUCOSE 183* 209* 129* 124*  125* 89  BUN 82* 81* 78* 93*  92* 97*  CREATININE 3.95* 3.01* 2.51* 2.86*  2.84* 3.68*  CALCIUM 8.3* 7.6* 8.2* 8.3*  8.2* 8.2*  PHOS  --  4.2 3.7 3.8 4.9*   Liver Function Tests: Recent Labs  Lab 2017/08/13 2015 07/16/17 0725  07/17/17 0243 07/18/17 0244 07/19/17 0254 07/20/17 0233  AST 67* 62*  --   --   --   --   ALT 35 33  --   --   --   --   ALKPHOS 136* 126  --   --   --   --   BILITOT 4.2* 3.7*  --   --   --   --   PROT 8.4* 7.1  --   --   --   --   ALBUMIN 2.4* 2.1* 1.7* 1.7* 1.6* 1.6*   Recent Labs  Lab 2018-06-12 2015  LIPASE 41   No results for input(s): AMMONIA in the last 168 hours. CBC: Recent Labs  Lab 07/16/17 0725 07/17/17 0243 07/18/17 0244 07/19/17 0254 07/20/17 0233  WBC  20.4* 17.4* 16.8* 16.4* 14.8*  HGB 11.8* 9.9* 11.5* 11.5* 12.1*  HCT 32.7* 26.9* 31.4* 31.5* 33.9*  MCV 100.6* 99.6 99.1 99.7 101.8*  PLT 114* 96* 90* 71* 52*   Cardiac Enzymes: Recent Labs  Lab 07/16/17 0129 07/16/17 0725 07/16/17 1252  TROPONINI 0.05* 0.05* 0.06*   BNP (last 3 results) No results for input(s): BNP in the last 8760 hours.  ProBNP (last 3 results) No results for input(s): PROBNP in the last 8760 hours.  CBG: Recent Labs  Lab 07/19/17 0804 07/19/17 1225 07/19/17 1653 07/19/17 2122 07/20/17 0819  GLUCAP 104* 107* 93 90 72    Recent Results (from the past 240 hour(s))  Urine culture     Status: Abnormal   Collection Time: 2018-06-12  7:48 PM  Result Value Ref Range Status   Specimen Description URINE, RANDOM  Final   Special Requests NONE  Final   Culture >=100,000 COLONIES/mL YEAST (A)  Final   Report Status 07/17/2017 FINAL  Final  Blood culture (routine x 2)     Status: Abnormal   Collection Time: 2018-06-12  8:15 PM  Result Value Ref Range Status   Specimen Description BLOOD RIGHT FOREARM  Final   Special Requests   Final    BOTTLES DRAWN AEROBIC AND ANAEROBIC Blood Culture adequate volume   Culture  Setup Time   Final    YEAST AEROBIC BOTTLE ONLY CRITICAL RESULT CALLED TO, READ BACK BY AND VERIFIED WITHPeggyann Juba: J GRIMSLEY [facility] LPHARMD [id number] 07/17/17 A BROWNING Performed at [facility], [address] [place], [place] [address]    Culture CANDIDA ALBICANS (A)  Final   Report Status 07/18/2017 FINAL  Final  Blood Culture ID Panel (Reflexed)     Status: Abnormal   Collection Time: 2018-06-12  8:15 PM  Result Value Ref Range Status   Enterococcus species NOT DETECTED NOT DETECTED Final   Listeria monocytogenes NOT DETECTED NOT DETECTED Final   Staphylococcus species NOT DETECTED NOT DETECTED Final   Staphylococcus aureus NOT DETECTED NOT DETECTED Final   Streptococcus species NOT DETECTED NOT DETECTED Final   Streptococcus agalactiae NOT DETECTED NOT  DETECTED Final   Streptococcus pneumoniae NOT DETECTED NOT DETECTED Final   Streptococcus pyogenes NOT DETECTED NOT DETECTED Final   Acinetobacter baumannii NOT DETECTED NOT DETECTED Final   Enterobacteriaceae species NOT DETECTED NOT DETECTED Final   Enterobacter cloacae complex NOT DETECTED NOT DETECTED Final   Escherichia coli NOT DETECTED NOT DETECTED Final   Klebsiella oxytoca NOT DETECTED NOT DETECTED Final   Klebsiella pneumoniae NOT DETECTED NOT DETECTED Final   Proteus species NOT DETECTED NOT DETECTED Final   Serratia marcescens NOT DETECTED NOT DETECTED Final   Haemophilus  influenzae NOT DETECTED NOT DETECTED Final   Neisseria meningitidis NOT DETECTED NOT DETECTED Final   Pseudomonas aeruginosa NOT DETECTED NOT DETECTED Final   Candida albicans DETECTED (A) NOT DETECTED Final    Comment: CRITICAL RESULT CALLED TO, READ BACK BY AND VERIFIED WITH: JC GRIMSLEY PHARMD AT 0445 BY AB    Candida glabrata NOT DETECTED NOT DETECTED Final   Candida krusei NOT DETECTED NOT DETECTED Final   Candida parapsilosis NOT DETECTED NOT DETECTED Final   Candida tropicalis NOT DETECTED NOT DETECTED Final    Comment: Performed at [facility], [address], [place], [place] [address]  Blood culture (routine x 2)     Status: Abnormal   Collection Time: 07/14/2017  8:44 PM  Result Value Ref Range Status   Specimen Description BLOOD LEFT FOREARM  Final   Special Requests IN PEDIATRIC BOTTLE Blood Culture adequate volume  Final   Culture  Setup Time (A)  Final    YEAST IN PEDIATRIC BOTTLE CRITICAL VALUE NOTED.  VALUE IS CONSISTENT WITH PREVIOUSLY REPORTED AND CALLED VALUE. Performed at [facility], [address], [place], [place] [address]    Culture CANDIDA ALBICANS (A)  Final   Report Status 07/19/2017 FINAL  Final  MRSA PCR Screening     Status: None   Collection Time: 07/16/17  3:15 PM  Result Value Ref Range Status   MRSA by PCR NEGATIVE NEGATIVE Final    Comment:          The GeneXpert MRSA Assay (FDA approved for NASAL specimens only), is one component of a comprehensive MRSA colonization surveillance program. It is not intended to diagnose MRSA infection nor to guide or monitor treatment for MRSA infections.   Culture, blood (routine x 2)     Status: None (Preliminary result)   Collection Time: 07/18/17  1:42 PM  Result Value Ref Range Status   Specimen Description BLOOD RIGHT ANTECUBITAL  Final   Special Requests   Final    BOTTLES DRAWN AEROBIC ONLY Blood Culture adequate volume   Culture   Final    NO GROWTH < 24 HOURS Performed at [facility], [address], [place], [place] [address]    Report Status PENDING  Incomplete  Culture, blood (routine x 2)     Status: None (Preliminary result)   Collection Time: 07/18/17  2:00 PM  Result Value Ref Range Status   Specimen Description BLOOD LEFT ARM  Final   Special Requests IN PEDIATRIC BOTTLE Blood Culture adequate volume  Final   Culture   Final    NO GROWTH < 24 HOURS Performed at [facility], [address], [place], [place] [address]    Report Status PENDING  Incomplete     Studies: Ct Head Wo Contrast  Result Date: 07/18/2017 CLINICAL DATA:  Metabolic encephalopathy with altered mental status EXAM: CT HEAD WITHOUT CONTRAST TECHNIQUE: Contiguous axial images were obtained from the base of the skull through the vertex without intravenous contrast. COMPARISON:  None. FINDINGS: Brain: There is moderate diffuse atrophy. There is no intracranial mass, hemorrhage, extra-axial fluid collection, or midline shift. There is widespread small vessel disease throughout the centra semiovale bilaterally. There is small vessel disease in each internal capsule. No acute appearing infarct evident. Vascular: There is no appreciable hyperdense vessel. There is calcification in each carotid siphon region. Skull: The bony calvarium appears intact. Sinuses/Orbits: There is opacification in both  maxillary antra. There is mucosal thickening in  several ethmoid air cells bilaterally. Other paranasal sinuses are clear. Orbits appear symmetric bilaterally. Other: Mastoid air cells are clear. IMPRESSION: Atrophy with extensive supratentorial small vessel disease. No evident acute infarct. No mass or hemorrhage. There are foci of arterial vascular calcification. There are multiple foci of paranasal sinus disease. Electronically Signed   By: Bretta Bang III M.D.   On: 07/18/2017 11:21    Scheduled Meds: . allopurinol  100 mg Oral Daily  . aspirin EC  81 mg Oral Daily  . brimonidine  1 drop Right Eye TID  . dorzolamide-timolol  1 drop Both Eyes BID  . feeding supplement  1 Container Oral BID BM  . feeding supplement (ENSURE ENLIVE)  237 mL Oral BID BM  . insulin aspart  0-5 Units Subcutaneous QHS  . insulin aspart  0-9 Units Subcutaneous TID WC  . latanoprost  1 drop Both Eyes QHS  . multivitamin with minerals  1 tablet Oral Daily  . pilocarpine  1 drop Right Eye TID   Continuous Infusions: . sodium chloride    . fluconazole (DIFLUCAN) IV Stopped (07/20/17 1610)      Time spent: 25 minutes    Levie Heritage  [facility] Pager: [phone] 07/20/2017, 9:22 AM  LOS: 5 days

## 2017-07-21 DIAGNOSIS — I959 Hypotension, unspecified: Secondary | ICD-10-CM

## 2017-07-21 DIAGNOSIS — Z91041 Radiographic dye allergy status: Secondary | ICD-10-CM

## 2017-07-21 DIAGNOSIS — Z888 Allergy status to other drugs, medicaments and biological substances status: Secondary | ICD-10-CM

## 2017-07-21 DIAGNOSIS — N289 Disorder of kidney and ureter, unspecified: Secondary | ICD-10-CM

## 2017-07-21 LAB — CBC
HCT: 32.6 % — ABNORMAL LOW (ref 39.0–52.0)
Hemoglobin: 11.5 g/dL — ABNORMAL LOW (ref 13.0–17.0)
MCH: 36.4 pg — ABNORMAL HIGH (ref 26.0–34.0)
MCHC: 35.3 g/dL (ref 30.0–36.0)
MCV: 103.2 fL — ABNORMAL HIGH (ref 78.0–100.0)
PLATELETS: 41 10*3/uL — AB (ref 150–400)
RBC: 3.16 MIL/uL — ABNORMAL LOW (ref 4.22–5.81)
RDW: 18.7 % — AB (ref 11.5–15.5)
WBC: 14.8 10*3/uL — AB (ref 4.0–10.5)

## 2017-07-21 LAB — BASIC METABOLIC PANEL
ANION GAP: 7 (ref 5–15)
BUN: 118 mg/dL — ABNORMAL HIGH (ref 6–20)
CALCIUM: 7.8 mg/dL — AB (ref 8.9–10.3)
CO2: 17 mmol/L — ABNORMAL LOW (ref 22–32)
CREATININE: 4.44 mg/dL — AB (ref 0.61–1.24)
Chloride: 119 mmol/L — ABNORMAL HIGH (ref 101–111)
GFR, EST AFRICAN AMERICAN: 12 mL/min — AB (ref >60)
GFR, EST NON AFRICAN AMERICAN: 11 mL/min — AB (ref >60)
Glucose, Bld: 99 mg/dL (ref 65–99)
Potassium: 5.1 mmol/L (ref 3.5–5.1)
Sodium: 143 mmol/L (ref 135–145)

## 2017-07-21 LAB — ALBUMIN: ALBUMIN: 1.4 g/dL — AB (ref 3.5–5.0)

## 2017-07-21 LAB — GLUCOSE, CAPILLARY
GLUCOSE-CAPILLARY: 92 mg/dL (ref 65–99)
Glucose-Capillary: 89 mg/dL (ref 65–99)

## 2017-07-21 LAB — PHOSPHORUS: Phosphorus: 5.8 mg/dL — ABNORMAL HIGH (ref 2.5–4.6)

## 2017-07-21 MED ORDER — ATROPINE SULFATE 1 % OP SOLN
2.0000 [drp] | Freq: Four times a day (QID) | OPHTHALMIC | Status: DC | PRN
  Administered 2017-07-21 – 2017-07-22 (×2): 2 [drp] via SUBLINGUAL
  Filled 2017-07-21: qty 2

## 2017-07-21 MED ORDER — SODIUM CHLORIDE 0.45 % IV BOLUS
1000.0000 mL | Freq: Once | INTRAVENOUS | Status: AC
  Administered 2017-07-21: 1000 mL via INTRAVENOUS

## 2017-07-21 NOTE — Progress Notes (Signed)
PMT progress note  Patient is resting in bed, his eyes are closed, appears unresponsive, does not appear to be in distress,    There is no family at the bedside currently.   Discussed with bedside RN as well as Dr Adrian BlackwaterStinson in the patient's room this am.   BP (!) 118/52   Pulse (!) 101   Temp 98.2 F (36.8 C) (Axillary)   Resp 14   Ht 5\' 6"  (1.676 m)   Wt 88.2 kg (194 lb 7.1 oz)   SpO2 96%   BMI 31.38 kg/m  Pertinent labs, imaging and chart reviewed: serum creatinine continues to worsen, urine output is declining as well.   Resting in bed Does not open eyes Mouth open Does not wince or grimace    Sepsis Fungemia UTI DM Acute metabolic encephalopathy Gradual progressive decline, ongoing, prior to this current hospitalization.   PMT continues to follow, agree with current mode of care, no additional palliative specific recommendations at this time, will continue to monitor trajectory and help assist the family in decision making and disposition planning as feasible.  Patient continues to show signs of decline, impending death, he has appropriate medical interventions going on, agree that he is not dialysis candidate at this juncture, continue current gentle efforts, anticipate hospital death, call placed, unable to reach daughter Almira CoasterGina this am at [phone].   15 minutes spent Rosalin HawkingZeba Elina Streng  MD [facility] palliative medicine team [phone]  [phone]

## 2017-07-21 NOTE — Progress Notes (Signed)
[facility] PROGRESS NOTE  [name] JWJ:[id number] DOB: 25-Nov-1925 DOA: 07/27/2017 PCP: [name], MD  Brief Summary: 84 year old male with history of type 2 diabetes, hypertension, gout presented with generalized weakness. Patient was recently treated with Keflex for UTI. Patient with urinary incontinence, dysuria urgency. He lives by himself. Admitted for UTI, acute kidney injury and sepsis. Examination consistent with bladder distention therefore Foley catheter was inserted on 1/7, drained 2600 cc of urine which was cloudy.  Assessment/Plan: Principal Problem:   Sepsis (HCC) Active Problems:   Type II diabetes mellitus with manifestations (HCC)   Anemia   Goals of care, counseling/discussion   UTI (urinary tract infection)   ARF (acute renal failure) (HCC)   Thrombocytopenia (HCC)   Hyponatremia   Encounter for palliative care   Fungemia   Acute metabolic encephalopathy   1. Sepsis  Patient appears to be actively dying: Repeat blood cultures show yeast, patient mildly hypotensive, creatinine has been increasing despite IV fluid boluses and simulation with Lasix.  I had a long conversation with the family regarding the patient's current status.  I reviewed laboratory data, vital signs, and prognosis with the patient's son and daughter.  I do not believe that further fluid boluses would be helpful at improving the patient's status, which I communicated with the family.  Due to the fungemia, the patient's not a candidate for dialysis.  At the present time, the family would like to continue the fluconazole, but would like to avoid any further intervention, such as fluid boluses, and would like to keep the patient comfort 2. Fungemia  Repeat blood culture continue to grow yeast   on fluconazole - will continue for now 3. UTI  Candida on culture  On fluconazole 4. Type 2 diabetes  CBG controlled  Continue sliding scale insulin 5. Acute renal  injury  Creatinine 4.44 today despite fluid boluses and lasix  Urine output: 100 mL in last 24 hours. 6. Acute metabolic encephalopathy  Morphine for pain  Code Status: DNR Family Communication: discussed pt with family members  Disposition Plan: pending   Consultants:  Infectious disease  Palliative Care  Procedures:  None  Antibiotics:  Fluconazole  HPI/Subjective: Pt somnolent, Per nursing, no acute events overnight. No fevers, chills.  Objective: Vitals:   07/21/17 0736 07/21/17 0800  BP:    Pulse:    Resp:    Temp: 98.1 F (36.7 C) 98.2 F (36.8 C)  SpO2:      Intake/Output Summary (Last 24 hours) at 07/21/2017 1145 Last data filed at 07/21/2017 1017 Gross per 24 hour  Intake 3397.92 ml  Output 100 ml  Net 3297.92 ml   Filed Weights   07/19/17 0450 07/20/17 0419 07/21/17 0500  Weight: 83.5 kg (184 lb 1.4 oz) 83.9 kg (184 lb 15.5 oz) 88.2 kg (194 lb 7.1 oz)    Exam:   General: Somnolent, but aggitated. No purposeful movement.  Cardiovascular: Regular rate.  No murmurs.  Normal S1-S2 sounds.     Respiratory: Clear to auscultation..  No wheezes, rales  Abdomen: Soft, nondistended.  No apparent discomfort on exam with palpation  Musculoskeletal: No purposeful movements  Extremity: Extremities warm to touch.  No edema.  Diminished pulses  Data Reviewed: Basic Metabolic Panel: Recent Labs  Lab 07/17/17 0243 07/18/17 0244 07/19/17 0254 07/20/17 0233 07/20/17 1402 07/21/17 0319  NA 135 142 147*  147* 149* 148* 143  K 3.9 3.6 4.3  4.1 5.1 5.0 5.1  CL 111 116* 120*  119*  124* 124* 119*  CO2 16* 20* 20*  19* 16* 18* 17*  GLUCOSE 209* 129* 124*  125* 89 103* 99  BUN 81* 78* 93*  92* 97* 111* 118*  CREATININE 3.01* 2.51* 2.86*  2.84* 3.68* 3.94* 4.44*  CALCIUM 7.6* 8.2* 8.3*  8.2* 8.2* 8.0* 7.8*  PHOS 4.2 3.7 3.8 4.9*  --  5.8*   Liver Function Tests: Recent Labs  Lab 07-24-2017 2015 07/16/17 0725 07/17/17 0243 07/18/17 0244  07/19/17 0254 07/20/17 0233 07/21/17 0319  AST 67* 62*  --   --   --   --   --   ALT 35 33  --   --   --   --   --   ALKPHOS 136* 126  --   --   --   --   --   BILITOT 4.2* 3.7*  --   --   --   --   --   PROT 8.4* 7.1  --   --   --   --   --   ALBUMIN 2.4* 2.1* 1.7* 1.7* 1.6* 1.6* 1.4*   Recent Labs  Lab 07-24-17 2015  LIPASE 41   No results for input(s): AMMONIA in the last 168 hours. CBC: Recent Labs  Lab 07/17/17 0243 07/18/17 0244 07/19/17 0254 07/20/17 0233 07/21/17 0319  WBC 17.4* 16.8* 16.4* 14.8* 14.8*  HGB 9.9* 11.5* 11.5* 12.1* 11.5*  HCT 26.9* 31.4* 31.5* 33.9* 32.6*  MCV 99.6 99.1 99.7 101.8* 103.2*  PLT 96* 90* 71* 52* 41*   Cardiac Enzymes: Recent Labs  Lab 07/16/17 0129 07/16/17 0725 07/16/17 1252  TROPONINI 0.05* 0.05* 0.06*   BNP (last 3 results) No results for input(s): BNP in the last 8760 hours.  ProBNP (last 3 results) No results for input(s): PROBNP in the last 8760 hours.  CBG: Recent Labs  Lab 07/20/17 0819 07/20/17 1205 07/20/17 1720 07/20/17 2125 07/21/17 0847  GLUCAP 72 86 79 86 92    Recent Results (from the past 240 hour(s))  Urine culture     Status: Abnormal   Collection Time: 07-24-2017  7:48 PM  Result Value Ref Range Status   Specimen Description URINE, RANDOM  Final   Special Requests NONE  Final   Culture >=100,000 COLONIES/mL YEAST (A)  Final   Report Status 07/17/2017 FINAL  Final  Blood culture (routine x 2)     Status: Abnormal   Collection Time: July 24, 2017  8:15 PM  Result Value Ref Range Status   Specimen Description BLOOD RIGHT FOREARM  Final   Special Requests   Final    BOTTLES DRAWN AEROBIC AND ANAEROBIC Blood Culture adequate volume   Culture  Setup Time   Final    YEAST AEROBIC BOTTLE ONLY CRITICAL RESULT CALLED TO, READ BACK BY AND VERIFIED WITHPeggyann Juba [facility] 1610 07/17/17 A BROWNING Performed at [facility], [address], [place], [place] [address]    Culture CANDIDA ALBICANS (A)   Final   Report Status 07/18/2017 FINAL  Final  Blood Culture ID Panel (Reflexed)     Status: Abnormal   Collection Time: 24-Jul-2017  8:15 PM  Result Value Ref Range Status   Enterococcus species NOT DETECTED NOT DETECTED Final   Listeria monocytogenes NOT DETECTED NOT DETECTED Final   Staphylococcus species NOT DETECTED NOT DETECTED Final   Staphylococcus aureus NOT DETECTED NOT DETECTED Final   Streptococcus species NOT DETECTED NOT DETECTED Final   Streptococcus agalactiae NOT DETECTED NOT DETECTED Final  Streptococcus pneumoniae NOT DETECTED NOT DETECTED Final   Streptococcus pyogenes NOT DETECTED NOT DETECTED Final   Acinetobacter baumannii NOT DETECTED NOT DETECTED Final   Enterobacteriaceae species NOT DETECTED NOT DETECTED Final   Enterobacter cloacae complex NOT DETECTED NOT DETECTED Final   Escherichia coli NOT DETECTED NOT DETECTED Final   Klebsiella oxytoca NOT DETECTED NOT DETECTED Final   Klebsiella pneumoniae NOT DETECTED NOT DETECTED Final   Proteus species NOT DETECTED NOT DETECTED Final   Serratia marcescens NOT DETECTED NOT DETECTED Final   Haemophilus influenzae NOT DETECTED NOT DETECTED Final   Neisseria meningitidis NOT DETECTED NOT DETECTED Final   Pseudomonas aeruginosa NOT DETECTED NOT DETECTED Final   Candida albicans DETECTED (A) NOT DETECTED Final    Comment: CRITICAL RESULT CALLED TO, READ BACK BY AND VERIFIED WITH: JC GRIMSLEY PHARMD AT 0445 BY AB    Candida glabrata NOT DETECTED NOT DETECTED Final   Candida krusei NOT DETECTED NOT DETECTED Final   Candida parapsilosis NOT DETECTED NOT DETECTED Final   Candida tropicalis NOT DETECTED NOT DETECTED Final    Comment: Performed at [facility], [address], [place], [place] [address]  Blood culture (routine x 2)     Status: Abnormal   Collection Time: 2017-07-18  8:44 PM  Result Value Ref Range Status   Specimen Description BLOOD LEFT FOREARM  Final   Special Requests IN PEDIATRIC BOTTLE Blood  Culture adequate volume  Final   Culture  Setup Time (A)  Final    YEAST IN PEDIATRIC BOTTLE CRITICAL VALUE NOTED.  VALUE IS CONSISTENT WITH PREVIOUSLY REPORTED AND CALLED VALUE. Performed at [facility], [address], [place], [place] [address]    Culture CANDIDA ALBICANS (A)  Final   Report Status 07/19/2017 FINAL  Final  MRSA PCR Screening     Status: None   Collection Time: 07/16/17  3:15 PM  Result Value Ref Range Status   MRSA by PCR NEGATIVE NEGATIVE Final    Comment:        The GeneXpert MRSA Assay (FDA approved for NASAL specimens only), is one component of a comprehensive MRSA colonization surveillance program. It is not intended to diagnose MRSA infection nor to guide or monitor treatment for MRSA infections.   Culture, blood (routine x 2)     Status: None (Preliminary result)   Collection Time: 07/18/17  1:42 PM  Result Value Ref Range Status   Specimen Description BLOOD RIGHT ANTECUBITAL  Final   Special Requests   Final    BOTTLES DRAWN AEROBIC ONLY Blood Culture adequate volume   Culture   Final    NO GROWTH 2 DAYS Performed at [facility], [address] [place] [address], [place], [place] [address]    Report Status PENDING  Incomplete  Culture, blood (routine x 2)     Status: Abnormal (Preliminary result)   Collection Time: 07/18/17  2:00 PM  Result Value Ref Range Status   Specimen Description BLOOD LEFT ARM  Final   Special Requests IN PEDIATRIC BOTTLE Blood Culture adequate volume  Final   Culture  Setup Time (A)  Final    YEAST IN PEDIATRIC BOTTLE CRITICAL RESULT CALLED TO, READ BACK BY AND VERIFIED WITH: T. GREEN PHARMD, AT 1843 07/20/17 BY D. VANHOOK    Culture (A)  Final    YEAST IDENTIFICATION TO FOLLOW Performed at [facility], [address], [place], [place] [address]    Report Status PENDING  Incomplete  Studies: Dg Abd Portable 1v  Result Date: 07/20/2017 CLINICAL DATA:  Abdominal tenderness and distension EXAM: PORTABLE  ABDOMEN - 1 VIEW COMPARISON:  None. FINDINGS: Scattered large and small bowel gas is noted. No definitive obstructive changes are seen this likely represents a generalized ileus. Small amount of fecal material is noted within the rectum. Degenerative changes of the thoracolumbar spine are noted with scoliosis concave to the right. IMPRESSION: Changes most consistent with a colonic ileus. Correlation with the physical exam is recommended. Electronically Signed   By: Alcide CleverMark  Lukens M.D.   On: 07/20/2017 11:18    Scheduled Meds: . allopurinol  100 mg Oral Daily  . aspirin EC  81 mg Oral Daily  . brimonidine  1 drop Right Eye TID  . dorzolamide-timolol  1 drop Both Eyes BID  . feeding supplement  1 Container Oral BID BM  . feeding supplement (ENSURE ENLIVE)  237 mL Oral BID BM  . insulin aspart  0-5 Units Subcutaneous QHS  . insulin aspart  0-9 Units Subcutaneous TID WC  . latanoprost  1 drop Both Eyes QHS  . multivitamin with minerals  1 tablet Oral Daily  . pilocarpine  1 drop Right Eye TID   Continuous Infusions: . sodium chloride 125 mL/hr at 07/21/17 1129  . fluconazole (DIFLUCAN) IV 200 mg (07/21/17 0549)      Time spent: 25 minutes    Levie HeritageJacob J Dresden Ament  [facility] Pager: [phone] 07/21/2017, 11:45 AM  LOS: 6 days

## 2017-07-21 NOTE — Progress Notes (Signed)
Patient ID: [name], male   DOB: Oct 19, 1925, 84 y.o.   MRN: [id number]          [facility] for Infectious Disease  Date of Admission:  08/06/2017           Day 6 fluconazole ASSESSMENT: He appears to be actively dying.  His renal function is worse, he is hypotensive and his platelets continue to drop.  He has become afebrile but I note that repeat blood cultures on 07/18/2017 are still growing yeast.  I talked to his son and daughter about management options in this situation.  They would like to hold off on repeat blood cultures but continue fluconazole for now.  PLAN: 1. Continue fluconazole  Principal Problem:   Sepsis (HCC) Active Problems:   Type II diabetes mellitus with manifestations (HCC)   Anemia   Goals of care, counseling/discussion   UTI (urinary tract infection)   ARF (acute renal failure) (HCC)   Thrombocytopenia (HCC)   Hyponatremia   Encounter for palliative care   Fungemia   Acute metabolic encephalopathy   Scheduled Meds: . allopurinol  100 mg Oral Daily  . aspirin EC  81 mg Oral Daily  . brimonidine  1 drop Right Eye TID  . dorzolamide-timolol  1 drop Both Eyes BID  . feeding supplement  1 Container Oral BID BM  . feeding supplement (ENSURE ENLIVE)  237 mL Oral BID BM  . insulin aspart  0-5 Units Subcutaneous QHS  . insulin aspart  0-9 Units Subcutaneous TID WC  . latanoprost  1 drop Both Eyes QHS  . multivitamin with minerals  1 tablet Oral Daily  . pilocarpine  1 drop Right Eye TID   Continuous Infusions: . sodium chloride 125 mL/hr at 07/21/17 0304  . fluconazole (DIFLUCAN) IV 200 mg (07/21/17 0549)   PRN Meds:.acetaminophen **OR** acetaminophen, bisacodyl, morphine injection, ondansetron (ZOFRAN) IV  Review of Systems: Review of Systems  Unable to perform ROS: Mental acuity    Allergies  Allergen Reactions  . Iodine     REACTION: rash---whleps  . Dipivefrin Rash  . Methazolamide Rash  . Metoclopramide Rash    Hives     OBJECTIVE: Vitals:   07/21/17 0530 07/21/17 0600 07/21/17 0736 07/21/17 0800  BP: (!) 108/51 (!) 118/52    Pulse: 98 (!) 101    Resp: 13 14    Temp:   98.1 F (36.7 C) 98.2 F (36.8 C)  TempSrc:   Axillary Axillary  SpO2: 96% 96%    Weight:      Height:       Body mass index is 31.38 kg/m.  Physical Exam  Constitutional:  He is resting quietly in bed.  He has not been able to interact with his family.  His son and daughter are at the bedside.  He appears comfortable.  Cardiovascular: Normal rate and regular rhythm.  No murmur heard. Pulmonary/Chest: Effort normal. He has no wheezes. He has no rales.    Lab Results Lab Results  Component Value Date   WBC 14.8 (H) 07/21/2017   HGB 11.5 (L) 07/21/2017   HCT 32.6 (L) 07/21/2017   MCV 103.2 (H) 07/21/2017   PLT 41 (L) 07/21/2017    Lab Results  Component Value Date   CREATININE 4.44 (H) 07/21/2017   BUN 118 (H) 07/21/2017   NA 143 07/21/2017   K 5.1 07/21/2017   CL 119 (H) 07/21/2017   CO2 17 (L) 07/21/2017    Lab Results  Component  Value Date   ALT 33 07/16/2017   AST 62 (H) 07/16/2017   ALKPHOS 126 07/16/2017   BILITOT 3.7 (H) 07/16/2017     Microbiology: Recent Results (from the past 240 hour(s))  Urine culture     Status: Abnormal   Collection Time: 01/20/2018  7:48 PM  Result Value Ref Range Status   Specimen Description URINE, RANDOM  Final   Special Requests NONE  Final   Culture >=100,000 COLONIES/mL YEAST (A)  Final   Report Status 07/17/2017 FINAL  Final  Blood culture (routine x 2)     Status: Abnormal   Collection Time: 01/20/2018  8:15 PM  Result Value Ref Range Status   Specimen Description BLOOD RIGHT FOREARM  Final   Special Requests   Final    BOTTLES DRAWN AEROBIC AND ANAEROBIC Blood Culture adequate volume   Culture  Setup Time   Final    YEAST AEROBIC BOTTLE ONLY CRITICAL RESULT CALLED TO, READ BACK BY AND VERIFIED WITHPeggyann Juba: J GRIMSLEY [facility] [id number] 07/17/17 A BROWNING Performed at [facility], [address] [place], [place] [address]    Culture CANDIDA ALBICANS (A)  Final   Report Status 07/18/2017 FINAL  Final  Blood Culture ID Panel (Reflexed)     Status: Abnormal   Collection Time: 01/20/2018  8:15 PM  Result Value Ref Range Status   Enterococcus species NOT DETECTED NOT DETECTED Final   Listeria monocytogenes NOT DETECTED NOT DETECTED Final   Staphylococcus species NOT DETECTED NOT DETECTED Final   Staphylococcus aureus NOT DETECTED NOT DETECTED Final   Streptococcus species NOT DETECTED NOT DETECTED Final   Streptococcus agalactiae NOT DETECTED NOT DETECTED Final   Streptococcus pneumoniae NOT DETECTED NOT DETECTED Final   Streptococcus pyogenes NOT DETECTED NOT DETECTED Final   Acinetobacter baumannii NOT DETECTED NOT DETECTED Final   Enterobacteriaceae species NOT DETECTED NOT DETECTED Final   Enterobacter cloacae complex NOT DETECTED NOT DETECTED Final   Escherichia coli NOT DETECTED NOT DETECTED Final   Klebsiella oxytoca NOT DETECTED NOT DETECTED Final   Klebsiella pneumoniae NOT DETECTED NOT DETECTED Final   Proteus species NOT DETECTED NOT DETECTED Final   Serratia marcescens NOT DETECTED NOT DETECTED Final   Haemophilus influenzae NOT DETECTED NOT DETECTED Final   Neisseria meningitidis NOT DETECTED NOT DETECTED Final   Pseudomonas aeruginosa NOT DETECTED NOT DETECTED Final   Candida albicans DETECTED (A) NOT DETECTED Final    Comment: CRITICAL RESULT CALLED TO, READ BACK BY AND VERIFIED WITH: JC GRIMSLEY PHARMD AT 0445 BY AB    Candida glabrata NOT DETECTED NOT DETECTED Final   Candida krusei NOT DETECTED NOT DETECTED Final   Candida parapsilosis NOT DETECTED NOT DETECTED Final   Candida tropicalis NOT DETECTED NOT DETECTED Final    Comment: Performed at [facility], [address] [place], [place] [address]  Blood culture (routine x 2)     Status: Abnormal   Collection Time: 01/20/2018  8:44 PM  Result Value Ref Range Status    Specimen Description BLOOD LEFT FOREARM  Final   Special Requests IN PEDIATRIC BOTTLE Blood Culture adequate volume  Final   Culture  Setup Time (A)  Final    YEAST IN PEDIATRIC BOTTLE CRITICAL VALUE NOTED.  VALUE IS CONSISTENT WITH PREVIOUSLY REPORTED AND CALLED VALUE. Performed at [facility], [address] [place], [place] [address]    Culture CANDIDA ALBICANS (A)  Final   Report Status 07/19/2017 FINAL  Final  MRSA PCR Screening     Status: None   Collection Time: 07/16/17  3:15 PM  Result Value Ref Range Status   MRSA by PCR NEGATIVE NEGATIVE Final    Comment:        The GeneXpert MRSA Assay (FDA approved for NASAL specimens only), is one component of a comprehensive MRSA colonization surveillance program. It is not intended to diagnose MRSA infection nor to guide or monitor treatment for MRSA infections.   Culture, blood (routine x 2)     Status: None (Preliminary result)   Collection Time: 07/18/17  1:42 PM  Result Value Ref Range Status   Specimen Description BLOOD RIGHT ANTECUBITAL  Final   Special Requests   Final    BOTTLES DRAWN AEROBIC ONLY Blood Culture adequate volume   Culture   Final    NO GROWTH 2 DAYS Performed at [facility], [address] [place], [place] [address]    Report Status PENDING  Incomplete  Culture, blood (routine x 2)     Status: Abnormal (Preliminary result)   Collection Time: 07/18/17  2:00 PM  Result Value Ref Range Status   Specimen Description BLOOD LEFT ARM  Final   Special Requests IN PEDIATRIC BOTTLE Blood Culture adequate volume  Final   Culture  Setup Time (A)  Final    YEAST IN PEDIATRIC BOTTLE CRITICAL RESULT CALLED TO, READ BACK BY AND VERIFIED WITH: T. GREEN PHARMD, AT 1843 07/20/17 BY D. VANHOOK    Culture (A)  Final    YEAST IDENTIFICATION TO FOLLOW Performed at [name], [address] [place], [place] [address]    Report Status PENDING  Incomplete    Cliffton Asters, MD [facility]  for Infectious Disease [facility] [phone] pager   [phone] cell 07/21/2017, 11:12 AM

## 2017-07-21 NOTE — Progress Notes (Addendum)
Patient's BP low 84/37 map 52. Low urine output 50ml over 6 hours. Paged Linton FlemingsX. Blount. 1liter 1/2 NS bolus ordered. Continue to monitor.

## 2017-07-21 NOTE — Progress Notes (Addendum)
Family was bedside of pt when I arrived. Pt made occasional moans during visit and prayer. His daughter, several family members and neighbors were bedside. CH offered assistance to family and had prayer.  Please page if pt's condition changes. [phone] Chaplain Marjory Liesamela Carrington Holder, MDiv   07/21/17 1500  Clinical Encounter Type  Visited With Patient;Family

## 2017-07-22 DIAGNOSIS — Z7189 Other specified counseling: Secondary | ICD-10-CM

## 2017-07-22 DIAGNOSIS — B377 Candidal sepsis: Principal | ICD-10-CM

## 2017-07-22 LAB — CULTURE, BLOOD (ROUTINE X 2): SPECIAL REQUESTS: ADEQUATE

## 2017-07-22 MED ORDER — GLYCOPYRROLATE 1 MG PO TABS: 1.0000 mg | ORAL_TABLET | ORAL | Status: DC | PRN

## 2017-07-22 MED ORDER — GLYCOPYRROLATE 0.2 MG/ML IJ SOLN
0.2000 mg | INTRAMUSCULAR | Status: DC | PRN
Start: 2017-07-22 — End: 2017-07-22

## 2017-07-22 MED ORDER — BIOTENE DRY MOUTH MT LIQD: 15.0000 mL | OROMUCOSAL | Status: DC | PRN

## 2017-07-22 MED ORDER — POLYVINYL ALCOHOL 1.4 % OP SOLN: 1.0000 [drp] | Freq: Four times a day (QID) | OPHTHALMIC | Status: DC | PRN

## 2017-07-22 MED ORDER — OXYCODONE HCL 20 MG/ML PO CONC: 5.0000 mg | ORAL | Status: DC | PRN

## 2017-07-22 MED ORDER — ACETAMINOPHEN 650 MG RE SUPP: 650.0000 mg | Freq: Four times a day (QID) | RECTAL | Status: DC | PRN

## 2017-07-22 MED ORDER — HALOPERIDOL 0.5 MG PO TABS: 0.5000 mg | ORAL_TABLET | ORAL | Status: DC | PRN

## 2017-07-22 MED ORDER — HALOPERIDOL LACTATE 2 MG/ML PO CONC
0.5000 mg | ORAL | Status: DC | PRN
  Filled 2017-07-22: qty 0.3

## 2017-07-22 MED ORDER — ONDANSETRON 4 MG PO TBDP: 4.0000 mg | ORAL_TABLET | Freq: Four times a day (QID) | ORAL | Status: DC | PRN

## 2017-07-22 MED ORDER — ONDANSETRON HCL 4 MG/2ML IJ SOLN: 4.0000 mg | Freq: Four times a day (QID) | INTRAMUSCULAR | Status: DC | PRN

## 2017-07-22 MED ORDER — GLYCOPYRROLATE 0.2 MG/ML IJ SOLN: 0.2000 mg | INTRAMUSCULAR | Status: DC | PRN

## 2017-07-22 MED ORDER — ACETAMINOPHEN 325 MG PO TABS: 650.0000 mg | ORAL_TABLET | Freq: Four times a day (QID) | ORAL | Status: DC | PRN

## 2017-07-22 MED ORDER — HALOPERIDOL LACTATE 5 MG/ML IJ SOLN: 0.5000 mg | INTRAMUSCULAR | Status: DC | PRN

## 2017-07-25 LAB — CULTURE, BLOOD (ROUTINE X 2): SPECIAL REQUESTS: ADEQUATE

## 2017-08-10 NOTE — Progress Notes (Signed)
[facility] PROGRESS NOTE  [name] ZOX:[id number] DOB: 1926-02-18 DOA: 07/21/2017 PCP: [name], MD  Brief Summary: 84 year old male with history of type 2 diabetes, hypertension, gout presented with generalized weakness. Patient was recently treated with Keflex for UTI. Patient with urinary incontinence, dysuria urgency. He lives by himself. Admitted for UTI, acute kidney injury and sepsis. Examination consistent with bladder distention therefore Foley catheter was inserted on 1/7, drained 2600 cc of urine which was cloudy.  Assessment/Plan: Principal Problem:   Sepsis (HCC) Active Problems:   Type II diabetes mellitus with manifestations (HCC)   Anemia   Goals of care, counseling/discussion   UTI (urinary tract infection)   ARF (acute renal failure) (HCC)   Thrombocytopenia (HCC)   Hyponatremia   Encounter for palliative care   Fungemia   Acute metabolic encephalopathy   1. Sepsis  Patient appears to be actively dying  Will deescalate care - CMO.  Transfer patient to palliative floor.   D/c IVF, telemetry 2. Fungemia  Repeat blood culture continue to grow yeast   CMO - d/c fluconazole 3. UTI 4. Type 2 diabetes 5. Acute renal injury 6. Acute metabolic encephalopathy 7. CMO  Haldol prn aggitation  Morphine prn pain, dyspnea  Rubinol prn secretions  Code Status: DNR Family Communication: discussed pt with family members  Disposition Plan: pending   Consultants:  Infectious disease  Palliative Care  Procedures:  None  Antibiotics:  Fluconazole  HPI/Subjective: Pt somnolent, Per nursing, no acute events overnight. No fevers, chills.  Objective: Vitals:   Aug 19, 2017 0600 08/19/17 0700  BP: (!) 59/27 (!) 59/26  Pulse: 87 86  Resp: 19 18  Temp:    SpO2: 93% 93%    Intake/Output Summary (Last 24 hours) at 08-19-2017 0832 Last data filed at 08/19/17 0600 Gross per 24 hour  Intake 2107.51 ml  Output 60 ml  Net 2047.51  ml   Filed Weights   07/20/17 0419 07/21/17 0500 08/19/17 0443  Weight: 83.9 kg (184 lb 15.5 oz) 88.2 kg (194 lb 7.1 oz) 89.6 kg (197 lb 8.5 oz)    Exam:   General: Somnolent. Resting comfortably.  Cardiovascular: Regular rate.  No murmurs.  Normal S1-S2 sounds.     Respiratory: Clear to auscultation..  No wheezes, rales  Abdomen: Soft  Data Reviewed: Basic Metabolic Panel: Recent Labs  Lab 07/17/17 0243 07/18/17 0244 07/19/17 0254 07/20/17 0233 07/20/17 1402 07/21/17 0319  NA 135 142 147*  147* 149* 148* 143  K 3.9 3.6 4.3  4.1 5.1 5.0 5.1  CL 111 116* 120*  119* 124* 124* 119*  CO2 16* 20* 20*  19* 16* 18* 17*  GLUCOSE 209* 129* 124*  125* 89 103* 99  BUN 81* 78* 93*  92* 97* 111* 118*  CREATININE 3.01* 2.51* 2.86*  2.84* 3.68* 3.94* 4.44*  CALCIUM 7.6* 8.2* 8.3*  8.2* 8.2* 8.0* 7.8*  PHOS 4.2 3.7 3.8 4.9*  --  5.8*   Liver Function Tests: Recent Labs  Lab 07/20/2017 2015 07/16/17 0725 07/17/17 0243 07/18/17 0244 07/19/17 0254 07/20/17 0233 07/21/17 0319  AST 67* 62*  --   --   --   --   --   ALT 35 33  --   --   --   --   --   ALKPHOS 136* 126  --   --   --   --   --   BILITOT 4.2* 3.7*  --   --   --   --   --  PROT 8.4* 7.1  --   --   --   --   --   ALBUMIN 2.4* 2.1* 1.7* 1.7* 1.6* 1.6* 1.4*   Recent Labs  Lab 08-01-17 2015  LIPASE 41   No results for input(s): AMMONIA in the last 168 hours. CBC: Recent Labs  Lab 07/17/17 0243 07/18/17 0244 07/19/17 0254 07/20/17 0233 07/21/17 0319  WBC 17.4* 16.8* 16.4* 14.8* 14.8*  HGB 9.9* 11.5* 11.5* 12.1* 11.5*  HCT 26.9* 31.4* 31.5* 33.9* 32.6*  MCV 99.6 99.1 99.7 101.8* 103.2*  PLT 96* 90* 71* 52* 41*   Cardiac Enzymes: Recent Labs  Lab 07/16/17 0129 07/16/17 0725 07/16/17 1252  TROPONINI 0.05* 0.05* 0.06*   BNP (last 3 results) No results for input(s): BNP in the last 8760 hours.  ProBNP (last 3 results) No results for input(s): PROBNP in the last 8760 hours.  CBG: Recent  Labs  Lab 07/20/17 1205 07/20/17 1720 07/20/17 2125 07/21/17 0847 07/21/17 1219  GLUCAP 86 79 86 92 89    Recent Results (from the past 240 hour(s))  Urine culture     Status: Abnormal   Collection Time: 08-01-17  7:48 PM  Result Value Ref Range Status   Specimen Description URINE, RANDOM  Final   Special Requests NONE  Final   Culture >=100,000 COLONIES/mL YEAST (A)  Final   Report Status 07/17/2017 FINAL  Final  Blood culture (routine x 2)     Status: Abnormal   Collection Time: 2017-08-01  8:15 PM  Result Value Ref Range Status   Specimen Description BLOOD RIGHT FOREARM  Final   Special Requests   Final    BOTTLES DRAWN AEROBIC AND ANAEROBIC Blood Culture adequate volume   Culture  Setup Time   Final    YEAST AEROBIC BOTTLE ONLY CRITICAL RESULT CALLED TO, READ BACK BY AND VERIFIED WITHPeggyann Juba [facility] 2130 07/17/17 A BROWNING Performed at [facility], [address] [place], [place] [address]    Culture CANDIDA ALBICANS (A)  Final   Report Status 07/18/2017 FINAL  Final  Blood Culture ID Panel (Reflexed)     Status: Abnormal   Collection Time: 08-01-2017  8:15 PM  Result Value Ref Range Status   Enterococcus species NOT DETECTED NOT DETECTED Final   Listeria monocytogenes NOT DETECTED NOT DETECTED Final   Staphylococcus species NOT DETECTED NOT DETECTED Final   Staphylococcus aureus NOT DETECTED NOT DETECTED Final   Streptococcus species NOT DETECTED NOT DETECTED Final   Streptococcus agalactiae NOT DETECTED NOT DETECTED Final   Streptococcus pneumoniae NOT DETECTED NOT DETECTED Final   Streptococcus pyogenes NOT DETECTED NOT DETECTED Final   Acinetobacter baumannii NOT DETECTED NOT DETECTED Final   Enterobacteriaceae species NOT DETECTED NOT DETECTED Final   Enterobacter cloacae complex NOT DETECTED NOT DETECTED Final   Escherichia coli NOT DETECTED NOT DETECTED Final   Klebsiella oxytoca NOT DETECTED NOT DETECTED Final   Klebsiella pneumoniae NOT DETECTED NOT  DETECTED Final   Proteus species NOT DETECTED NOT DETECTED Final   Serratia marcescens NOT DETECTED NOT DETECTED Final   Haemophilus influenzae NOT DETECTED NOT DETECTED Final   Neisseria meningitidis NOT DETECTED NOT DETECTED Final   Pseudomonas aeruginosa NOT DETECTED NOT DETECTED Final   Candida albicans DETECTED (A) NOT DETECTED Final    Comment: CRITICAL RESULT CALLED TO, READ BACK BY AND VERIFIED WITH: JC GRIMSLEY PHARMD AT 0445 BY AB    Candida glabrata NOT DETECTED NOT DETECTED Final   Candida krusei NOT  DETECTED NOT DETECTED Final   Candida parapsilosis NOT DETECTED NOT DETECTED Final   Candida tropicalis NOT DETECTED NOT DETECTED Final    Comment: Performed at [facility], [address] [place], [place] [address]  Blood culture (routine x 2)     Status: Abnormal   Collection Time: 07/25/2017  8:44 PM  Result Value Ref Range Status   Specimen Description BLOOD LEFT FOREARM  Final   Special Requests IN PEDIATRIC BOTTLE Blood Culture adequate volume  Final   Culture  Setup Time (A)  Final    YEAST IN PEDIATRIC BOTTLE CRITICAL VALUE NOTED.  VALUE IS CONSISTENT WITH PREVIOUSLY REPORTED AND CALLED VALUE. Performed at [facility], [address] [place], [place] [address]    Culture CANDIDA ALBICANS (A)  Final   Report Status 07/19/2017 FINAL  Final  MRSA PCR Screening     Status: None   Collection Time: 07/16/17  3:15 PM  Result Value Ref Range Status   MRSA by PCR NEGATIVE NEGATIVE Final    Comment:        The GeneXpert MRSA Assay (FDA approved for NASAL specimens only), is one component of a comprehensive MRSA colonization surveillance program. It is not intended to diagnose MRSA infection nor to guide or monitor treatment for MRSA infections.   Culture, blood (routine x 2)     Status: None (Preliminary result)   Collection Time: 07/18/17  1:42 PM  Result Value Ref Range Status   Specimen Description BLOOD RIGHT ANTECUBITAL  Final   Special Requests    Final    BOTTLES DRAWN AEROBIC ONLY Blood Culture adequate volume   Culture   Final    NO GROWTH 3 DAYS Performed at [facility], [address] [place], [place] [address]    Report Status PENDING  Incomplete  Culture, blood (routine x 2)     Status: Abnormal (Preliminary result)   Collection Time: 07/18/17  2:00 PM  Result Value Ref Range Status   Specimen Description BLOOD LEFT ARM  Final   Special Requests IN PEDIATRIC BOTTLE Blood Culture adequate volume  Final   Culture  Setup Time (A)  Final    YEAST IN PEDIATRIC BOTTLE CRITICAL RESULT CALLED TO, READ BACK BY AND VERIFIED WITH: T. GREEN PHARMD, AT 1843 07/20/17 BY D. VANHOOK    Culture (A)  Final    YEAST IDENTIFICATION TO FOLLOW Performed at [facility], [address] [place], [place] [address]    Report Status PENDING  Incomplete     Studies: Dg Abd Portable 1v  Result Date: 07/20/2017 CLINICAL DATA:  Abdominal tenderness and distension EXAM: PORTABLE ABDOMEN - 1 VIEW COMPARISON:  None. FINDINGS: Scattered large and small bowel gas is noted. No definitive obstructive changes are seen this likely represents a generalized ileus. Small amount of fecal material is noted within the rectum. Degenerative changes of the thoracolumbar spine are noted with scoliosis concave to the right. IMPRESSION: Changes most consistent with a colonic ileus. Correlation with the physical exam is recommended. Electronically Signed   By: Alcide CleverMark  Lukens M.D.   On: 07/20/2017 11:18    Scheduled Meds:  Continuous Infusions: . sodium chloride 50 mL/hr at Jun 30, 2018 0600  . fluconazole (DIFLUCAN) IV Stopped (Jun 30, 2018 [id number])      Time spent: 15 minutes    Levie HeritageJacob J Dailen Mcclish  [facility] Pager: [phone] 02/18/2018, 8:32 AM  LOS: 7 days

## 2017-08-10 NOTE — Progress Notes (Signed)
Follow-up visit w/family as daughter, son, and granddaughters of pt. Pt's son, daughter and granddaughter had spent the night. Other family members joined during the visit. Stayed w/family until pt was transferred to [address]. Family was very appreciative of continued support. Elmarie Shileyamela Carrington BuhlerHolder, [place]   07/24/2017 [id number]  Clinical Encounter Type  Visited With Family

## 2017-08-10 NOTE — Discharge Summary (Signed)
Death Summary  [name] ZOX:[id number] DOB: 1926/05/22 DOA: 07/26/2017  PCP: [name], MD  Admit date: 07/26/17 Date of Death: 08-02-2017 Time of Death: Nov 26, 1128 Notification: [name], MD notified of death of August 02, 2017   History of present illness:  84 year old male with history of type 2 diabetes, hypertension, gout presented with generalized weakness. Patient was recently treated with Keflex for UTI. Patient with urinary incontinence, dysuria urgency. He lives by himself. Admitted for UTI, acute kidney injury and sepsis. Examination consistent with bladder distention therefore Foley catheter was inserted on 1/7, drained 2600 cc of urine which was cloudy.  Patient admitted on 1/6 on fluconazole for Sepsis, acute renal failure, fungal UTI and fungemia.  Patient remained fairly somnolent and critically ill during the hospitalization.  His creatinine worsened throughout the hospitalization and unresponsive to fluid challenges.  His urine output diminished and eventually the family made the patient comfort measures only.  The patient passway 1130 on Aug 02, 2022.   Final Diagnoses:  1. ARF 2. Sepsis 3. Fungemia 4. Fungal UTI 5. DM2   The results of significant diagnostics from this hospitalization (including imaging, microbiology, ancillary and laboratory) are listed below for reference.    Significant Diagnostic Studies: Ct Head Wo Contrast  Result Date: 07/18/2017 CLINICAL DATA:  Metabolic encephalopathy with altered mental status EXAM: CT HEAD WITHOUT CONTRAST TECHNIQUE: Contiguous axial images were obtained from the base of the skull through the vertex without intravenous contrast. COMPARISON:  None. FINDINGS: Brain: There is moderate diffuse atrophy. There is no intracranial mass, hemorrhage, extra-axial fluid collection, or midline shift. There is widespread small vessel disease throughout the centra semiovale bilaterally. There is small vessel disease in each internal  capsule. No acute appearing infarct evident. Vascular: There is no appreciable hyperdense vessel. There is calcification in each carotid siphon region. Skull: The bony calvarium appears intact. Sinuses/Orbits: There is opacification in both maxillary antra. There is mucosal thickening in several ethmoid air cells bilaterally. Other paranasal sinuses are clear. Orbits appear symmetric bilaterally. Other: Mastoid air cells are clear. IMPRESSION: Atrophy with extensive supratentorial small vessel disease. No evident acute infarct. No mass or hemorrhage. There are foci of arterial vascular calcification. There are multiple foci of paranasal sinus disease. Electronically Signed   By: Bretta Bang III M.D.   On: 07/18/2017 11:21   US Renal  Result Date: July 26, 2017 CLINICAL DATA:  Acute renal failure. EXAM: RENAL / URINARY TRACT ULTRASOUND COMPLETE COMPARISON:  None. FINDINGS: Examination is technically limited due to shadowing bowel gas. Right Kidney: Only faintly visualized. Length: 10.1 cm. No obvious hydronephrosis, however significantly limited evaluation. Left Kidney: Only faintly visualized. Length: 10.9 cm. No obvious hydronephrosis, however significantly limited evaluation. Bladder: Appears normal for degree of bladder distention. Bladder volume approximately 15.2 x 16.7 x 21.3 cm (volume = 26-Nov-2828 cm^3). IMPRESSION: 1. Marked urinary bladder distention. 2. Kidneys poorly visualized. No gross hydronephrosis, however limited exam due to shadowing bowel gas obscuring the kidneys. Electronically Signed   By: Rubye Oaks M.D.   On: 26-Jul-2017 23:42   Dg Chest Port 1 View  Result Date: 2017-07-26 CLINICAL DATA:  Pt has been getting weaker and weaker recently. Pt was not able to stand under his own power today. Pt taking HTN meds. Pt is diabetic. Nonsmoker. Pt has a hx of smoking pipes and cigars. EXAM: PORTABLE CHEST 1 VIEW COMPARISON:  02/27/2013 FINDINGS: Heart is enlarged. Shallow lung inflation. There  are no focal consolidations or pleural effusions. No pulmonary edema. Chronic changes in  both shoulders. IMPRESSION: Shallow lung inflation.  No evidence for acute  abnormality. Electronically Signed   By: Norva PavlovElizabeth  Brown M.D.   On: 18-Aug-2017 20:09   Dg Abd Portable 1v  Result Date: 07/20/2017 CLINICAL DATA:  Abdominal tenderness and distension EXAM: PORTABLE ABDOMEN - 1 VIEW COMPARISON:  None. FINDINGS: Scattered large and small bowel gas is noted. No definitive obstructive changes are seen this likely represents a generalized ileus. Small amount of fecal material is noted within the rectum. Degenerative changes of the thoracolumbar spine are noted with scoliosis concave to the right. IMPRESSION: Changes most consistent with a colonic ileus. Correlation with the physical exam is recommended. Electronically Signed   By: Alcide CleverMark  Lukens M.D.   On: 07/20/2017 11:18    Microbiology: Recent Results (from the past 240 hour(s))  Urine culture     Status: Abnormal   Collection Time: 17-Nov-2017  7:48 PM  Result Value Ref Range Status   Specimen Description URINE, RANDOM  Final   Special Requests NONE  Final   Culture >=100,000 COLONIES/mL YEAST (A)  Final   Report Status 07/17/2017 FINAL  Final  Blood culture (routine x 2)     Status: Abnormal   Collection Time: 17-Nov-2017  8:15 PM  Result Value Ref Range Status   Specimen Description BLOOD RIGHT FOREARM  Final   Special Requests   Final    BOTTLES DRAWN AEROBIC AND ANAEROBIC Blood Culture adequate volume   Culture  Setup Time   Final    YEAST AEROBIC BOTTLE ONLY CRITICAL RESULT CALLED TO, READ BACK BY AND VERIFIED WITHPeggyann Juba: J GRIMSLEY [facility] [id number] 07/17/17 A BROWNING Performed at [facility], [address] [place], [place] [address]    Culture CANDIDA ALBICANS (A)  Final   Report Status 07/18/2017 FINAL  Final  Blood Culture ID Panel (Reflexed)     Status: Abnormal   Collection Time: 17-Nov-2017  8:15 PM  Result Value Ref Range Status    Enterococcus species NOT DETECTED NOT DETECTED Final   Listeria monocytogenes NOT DETECTED NOT DETECTED Final   Staphylococcus species NOT DETECTED NOT DETECTED Final   Staphylococcus aureus NOT DETECTED NOT DETECTED Final   Streptococcus species NOT DETECTED NOT DETECTED Final   Streptococcus agalactiae NOT DETECTED NOT DETECTED Final   Streptococcus pneumoniae NOT DETECTED NOT DETECTED Final   Streptococcus pyogenes NOT DETECTED NOT DETECTED Final   Acinetobacter baumannii NOT DETECTED NOT DETECTED Final   Enterobacteriaceae species NOT DETECTED NOT DETECTED Final   Enterobacter cloacae complex NOT DETECTED NOT DETECTED Final   Escherichia coli NOT DETECTED NOT DETECTED Final   Klebsiella oxytoca NOT DETECTED NOT DETECTED Final   Klebsiella pneumoniae NOT DETECTED NOT DETECTED Final   Proteus species NOT DETECTED NOT DETECTED Final   Serratia marcescens NOT DETECTED NOT DETECTED Final   Haemophilus influenzae NOT DETECTED NOT DETECTED Final   Neisseria meningitidis NOT DETECTED NOT DETECTED Final   Pseudomonas aeruginosa NOT DETECTED NOT DETECTED Final   Candida albicans DETECTED (A) NOT DETECTED Final    Comment: CRITICAL RESULT CALLED TO, READ BACK BY AND VERIFIED WITH: JC GRIMSLEY PHARMD AT 0445 BY AB    Candida glabrata NOT DETECTED NOT DETECTED Final   Candida krusei NOT DETECTED NOT DETECTED Final   Candida parapsilosis NOT DETECTED NOT DETECTED Final   Candida tropicalis NOT DETECTED NOT DETECTED Final    Comment: Performed at [facility], [address] [place], [place] [address]  Blood culture (routine x 2)  Status: Abnormal   Collection Time: 08-01-17  8:44 PM  Result Value Ref Range Status   Specimen Description BLOOD LEFT FOREARM  Final   Special Requests IN PEDIATRIC BOTTLE Blood Culture adequate volume  Final   Culture  Setup Time (A)  Final    YEAST IN PEDIATRIC BOTTLE CRITICAL VALUE NOTED.  VALUE IS CONSISTENT WITH PREVIOUSLY REPORTED AND CALLED  VALUE. Performed at [facility], [address], [place], [place] [address]    Culture CANDIDA ALBICANS (A)  Final   Report Status 07/19/2017 FINAL  Final  MRSA PCR Screening     Status: None   Collection Time: 07/16/17  3:15 PM  Result Value Ref Range Status   MRSA by PCR NEGATIVE NEGATIVE Final    Comment:        The GeneXpert MRSA Assay (FDA approved for NASAL specimens only), is one component of a comprehensive MRSA colonization surveillance program. It is not intended to diagnose MRSA infection nor to guide or monitor treatment for MRSA infections.   Culture, blood (routine x 2)     Status: None (Preliminary result)   Collection Time: 07/18/17  1:42 PM  Result Value Ref Range Status   Specimen Description BLOOD RIGHT ANTECUBITAL  Final   Special Requests   Final    BOTTLES DRAWN AEROBIC ONLY Blood Culture adequate volume   Culture   Final    NO GROWTH 3 DAYS Performed at [facility], [address] [place], [place] [address]    Report Status PENDING  Incomplete  Culture, blood (routine x 2)     Status: Abnormal (Preliminary result)   Collection Time: 07/18/17  2:00 PM  Result Value Ref Range Status   Specimen Description BLOOD LEFT ARM  Final   Special Requests IN PEDIATRIC BOTTLE Blood Culture adequate volume  Final   Culture  Setup Time (A)  Final    YEAST IN PEDIATRIC BOTTLE CRITICAL RESULT CALLED TO, READ BACK BY AND VERIFIED WITH: T. GREEN PHARMD, AT 1843 07/20/17 BY D. VANHOOK    Culture (A)  Final    YEAST IDENTIFICATION TO FOLLOW Performed at [facility], [address] [place], [place] [address]    Report Status PENDING  Incomplete     Labs: Basic Metabolic Panel: Recent Labs  Lab 07/17/17 0243 07/18/17 0244 07/19/17 0254 07/20/17 0233 07/20/17 1402 07/21/17 0319  NA 135 142 147*  147* 149* 148* 143  K 3.9 3.6 4.3  4.1 5.1 5.0 5.1  CL 111 116* 120*  119* 124* 124* 119*  CO2 16* 20* 20*  19* 16* 18* 17*  GLUCOSE  209* 129* 124*  125* 89 103* 99  BUN 81* 78* 93*  92* 97* 111* 118*  CREATININE 3.01* 2.51* 2.86*  2.84* 3.68* 3.94* 4.44*  CALCIUM 7.6* 8.2* 8.3*  8.2* 8.2* 8.0* 7.8*  PHOS 4.2 3.7 3.8 4.9*  --  5.8*   Liver Function Tests: Recent Labs  Lab Aug 01, 2017 2015 07/16/17 0725 07/17/17 0243 07/18/17 0244 07/19/17 0254 07/20/17 0233 07/21/17 0319  AST 67* 62*  --   --   --   --   --   ALT 35 33  --   --   --   --   --   ALKPHOS 136* 126  --   --   --   --   --   BILITOT 4.2* 3.7*  --   --   --   --   --  PROT 8.4* 7.1  --   --   --   --   --   ALBUMIN 2.4* 2.1* 1.7* 1.7* 1.6* 1.6* 1.4*   Recent Labs  Lab August 02, 2017 2015  LIPASE 41   No results for input(s): AMMONIA in the last 168 hours. CBC: Recent Labs  Lab 07/17/17 0243 07/18/17 0244 07/19/17 0254 07/20/17 0233 07/21/17 0319  WBC 17.4* 16.8* 16.4* 14.8* 14.8*  HGB 9.9* 11.5* 11.5* 12.1* 11.5*  HCT 26.9* 31.4* 31.5* 33.9* 32.6*  MCV 99.6 99.1 99.7 101.8* 103.2*  PLT 96* 90* 71* 52* 41*   Cardiac Enzymes: Recent Labs  Lab 07/16/17 0129 07/16/17 0725 07/16/17 1252  TROPONINI 0.05* 0.05* 0.06*   D-Dimer No results for input(s): DDIMER in the last 72 hours. BNP: Invalid input(s): POCBNP CBG: Recent Labs  Lab 07/20/17 1205 07/20/17 1720 07/20/17 2125 07/21/17 0847 07/21/17 1219  GLUCAP 86 79 86 92 89   Anemia work up No results for input(s): VITAMINB12, FOLATE, FERRITIN, TIBC, IRON, RETICCTPCT in the last 72 hours. Urinalysis    Component Value Date/Time   COLORURINE YELLOW 2017/08/02 1816   APPEARANCEUR TURBID (A) 08/02/17 1816   LABSPEC 1.011 08/02/17 1816   PHURINE 5.0 2017/08/02 1816   GLUCOSEU 50 (A) August 02, 2017 1816   GLUCOSEU NEGATIVE 06/15/2015 1341   HGBUR LARGE (A) Aug 02, 2017 1816   BILIRUBINUR NEGATIVE 08/02/17 1816   BILIRUBINUR Negative 07/11/2017 0710   KETONESUR NEGATIVE 2017-08-02 1816   PROTEINUR 100 (A) 2017-08-02 1816   UROBILINOGEN 1.0 07/11/2017 0710   UROBILINOGEN 0.2  06/15/2015 1341   NITRITE NEGATIVE Aug 02, 2017 1816   LEUKOCYTESUR LARGE (A) 2017/08/02 1816   Sepsis Labs Invalid input(s): PROCALCITONIN,  WBC,  LACTICIDVEN     SIGNED:  Levie Heritage, MD  [facility] 07/19/2017, 12:20 PM Pager   If 7PM-7AM, please contact night-coverage [email or web address] Password [email or web address]
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
## 2017-08-10 NOTE — Progress Notes (Signed)
Pt had passed when I arrived; family was still bedside processing their loss and new grief. I provided empathic emotional and spiritual support and had prayer w/family bedside of pt. Family was appropriately tearful and very thankful for Chaplain support. I stayed w/family until they departed.  [facility] would like [facility] [place] for [place]. Marjory LiesPamela Carrington Holder, MDiv.   09-Jul-2018 1300  Clinical Encounter Type  Visited With Family

## 2017-08-10 NOTE — Progress Notes (Signed)
1130 Called the room by family the patient was not breathing, no palpable or apical pulse. Dr Adrian BlackwaterStinson has been paged

## 2017-08-10 DEATH — deceased

## 2017-10-01 ENCOUNTER — Ambulatory Visit: Payer: Medicare Other | Admitting: Internal Medicine

## 2018-03-22 IMAGING — DX DG ABD PORTABLE 1V
2 series · 2 of 2 positions shown · non-contrast
Comparison: None.

CLINICAL DATA: Abdominal tenderness and distension

EXAM:
PORTABLE ABDOMEN - 1 VIEW

[abdomen kub (1 of 2)]
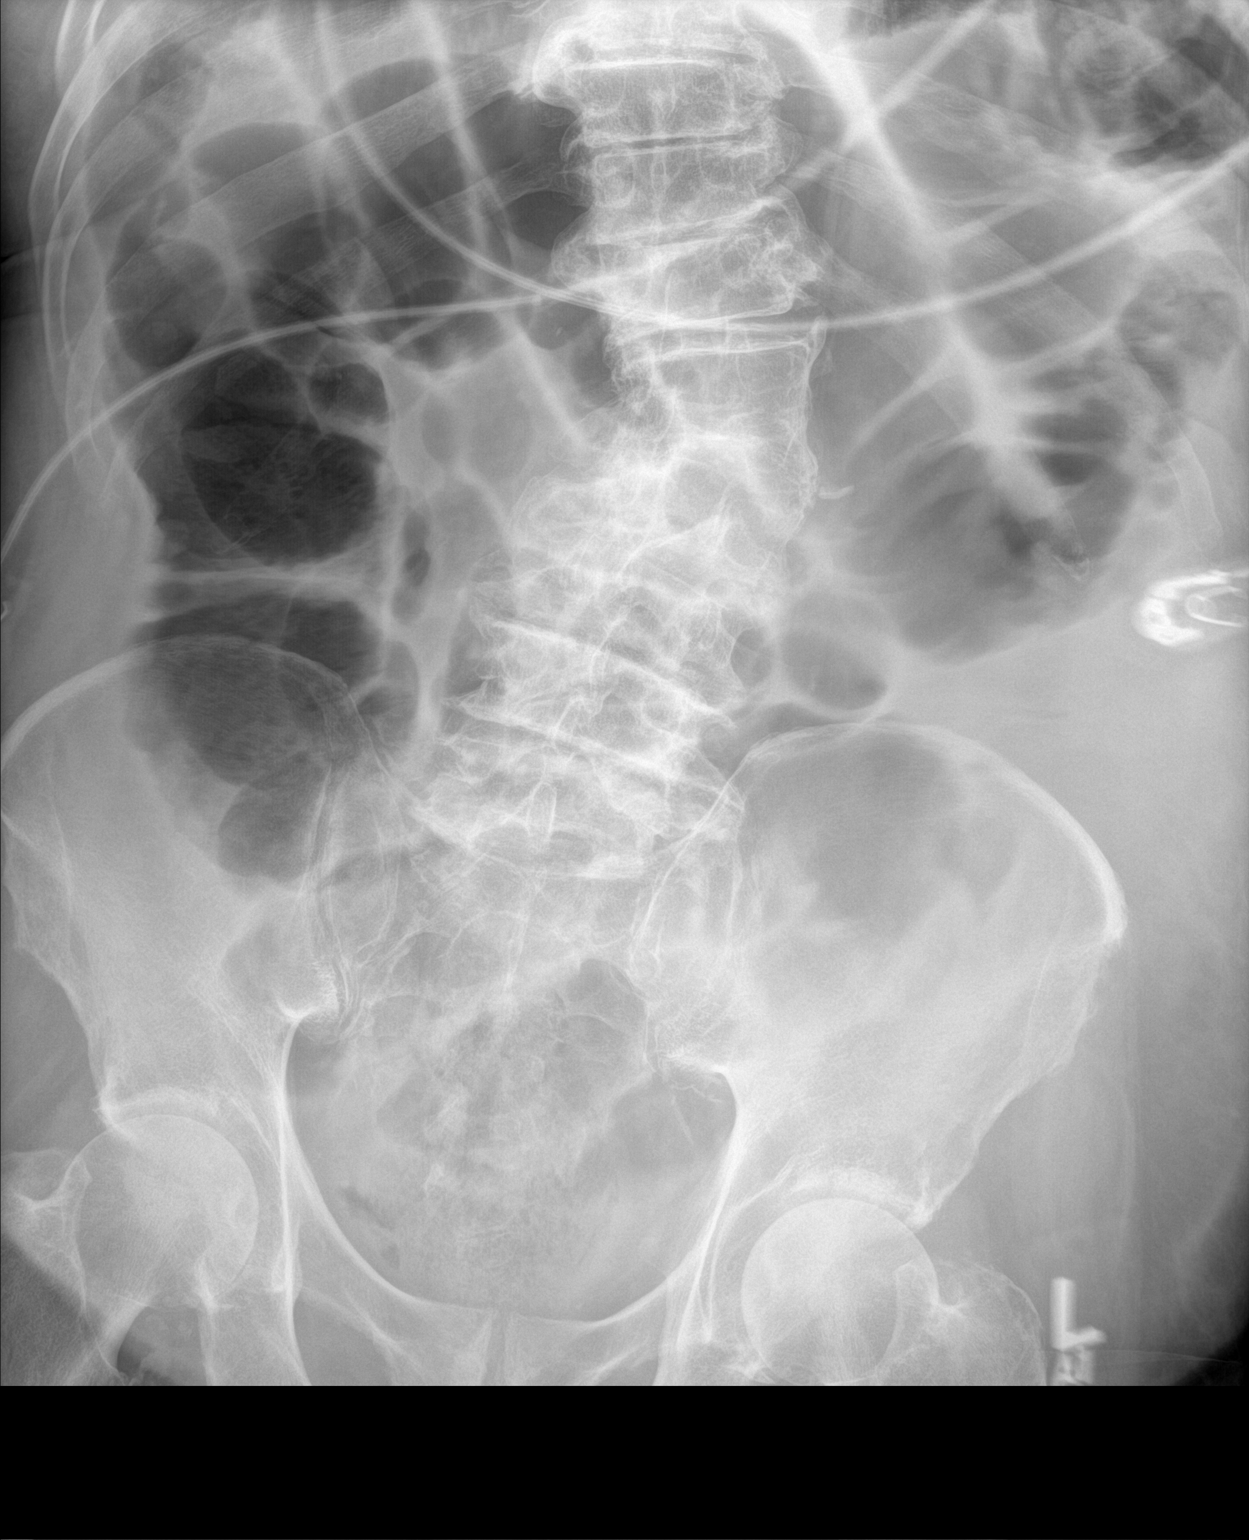

[abdomen kub (2 of 2)]
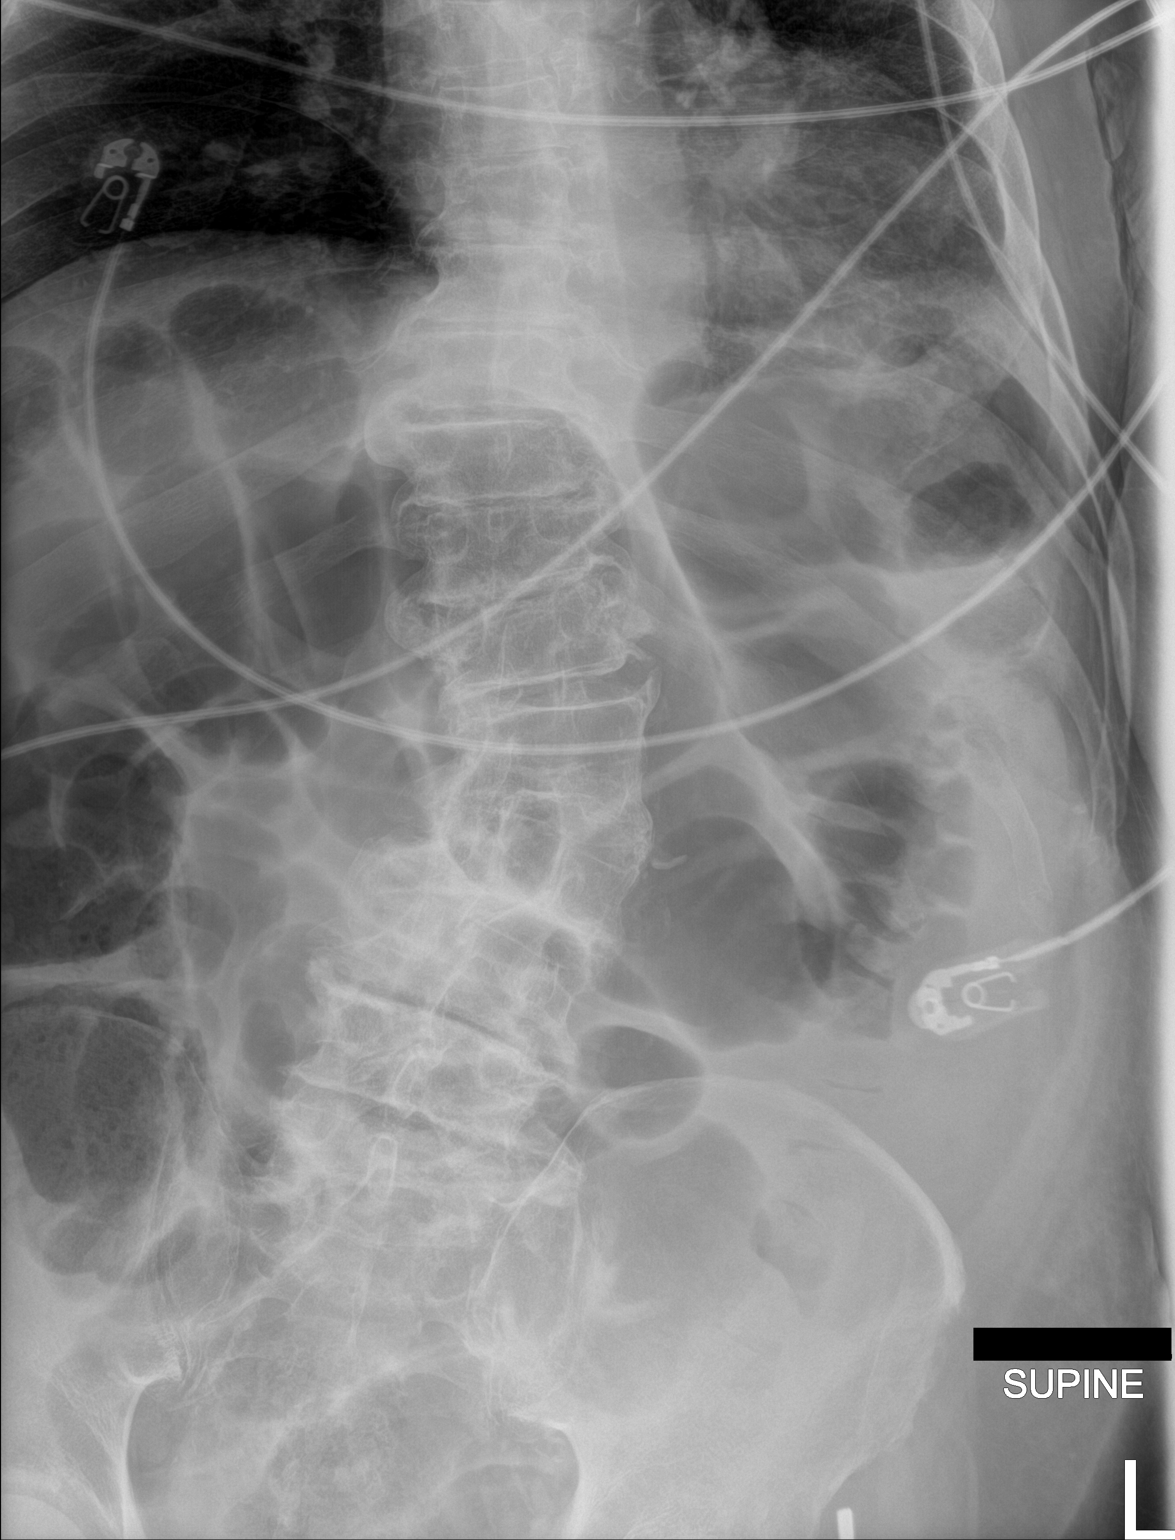

[2 of 2 positions shown; findings below may reference images not displayed]

FINDINGS: Scattered large and small bowel gas is noted. No definitive
obstructive changes are seen this likely represents a generalized
ileus. Small amount of fecal material is noted within the rectum.
Degenerative changes of the thoracolumbar spine are noted with
scoliosis concave to the right.
IMPRESSION: Changes most consistent with a colonic ileus. Correlation with the
physical exam is recommended.
